# Patient Record
Sex: Female | Born: 1975 | Race: White | Hispanic: No | Marital: Married | Smoking: Never smoker
Health system: Southern US, Community
[De-identification: ages and names within clinical notes are randomized; demographics above are authoritative.]

## PROBLEM LIST (undated history)

## (undated) DIAGNOSIS — D649 Anemia, unspecified: Secondary | ICD-10-CM

## (undated) DIAGNOSIS — IMO0002 Reserved for concepts with insufficient information to code with codable children: Secondary | ICD-10-CM

## (undated) DIAGNOSIS — N814 Uterovaginal prolapse, unspecified: Secondary | ICD-10-CM

## (undated) HISTORY — DX: Anemia, unspecified: D64.9

## (undated) HISTORY — PX: OTHER SURGICAL HISTORY: SHX169

## (undated) HISTORY — DX: Reserved for concepts with insufficient information to code with codable children: IMO0002

## (undated) HISTORY — DX: Uterovaginal prolapse, unspecified: N81.4

---

## 2002-12-12 ENCOUNTER — Emergency Department (HOSPITAL_COMMUNITY): Admission: EM | Admit: 2002-12-12 | Discharge: 2002-12-12 | Payer: Self-pay | Admitting: Emergency Medicine

## 2003-08-13 ENCOUNTER — Other Ambulatory Visit: Admission: RE | Admit: 2003-08-13 | Discharge: 2003-08-13 | Payer: Self-pay | Admitting: Family Medicine

## 2004-09-01 ENCOUNTER — Other Ambulatory Visit: Admission: RE | Admit: 2004-09-01 | Discharge: 2004-09-01 | Payer: Self-pay | Admitting: Gynecology

## 2005-03-25 ENCOUNTER — Inpatient Hospital Stay (HOSPITAL_COMMUNITY): Admission: AD | Admit: 2005-03-25 | Discharge: 2005-03-27 | Payer: Self-pay | Admitting: Gynecology

## 2005-05-05 ENCOUNTER — Other Ambulatory Visit: Admission: RE | Admit: 2005-05-05 | Discharge: 2005-05-05 | Payer: Self-pay | Admitting: Gynecology

## 2006-05-17 ENCOUNTER — Other Ambulatory Visit: Admission: RE | Admit: 2006-05-17 | Discharge: 2006-05-17 | Payer: Self-pay | Admitting: Gynecology

## 2007-06-19 ENCOUNTER — Other Ambulatory Visit: Admission: RE | Admit: 2007-06-19 | Discharge: 2007-06-19 | Payer: Self-pay | Admitting: Gynecology

## 2008-04-01 ENCOUNTER — Inpatient Hospital Stay (HOSPITAL_COMMUNITY): Admission: AD | Admit: 2008-04-01 | Discharge: 2008-04-04 | Payer: Self-pay | Admitting: Obstetrics and Gynecology

## 2009-02-17 ENCOUNTER — Ambulatory Visit (HOSPITAL_COMMUNITY): Admission: RE | Admit: 2009-02-17 | Discharge: 2009-02-17 | Payer: Self-pay | Admitting: Gynecology

## 2009-06-03 ENCOUNTER — Other Ambulatory Visit: Admission: RE | Admit: 2009-06-03 | Discharge: 2009-06-03 | Payer: Self-pay | Admitting: Gynecology

## 2009-06-03 ENCOUNTER — Ambulatory Visit: Payer: Self-pay | Admitting: Gynecology

## 2009-06-09 ENCOUNTER — Ambulatory Visit: Payer: Self-pay | Admitting: Gynecology

## 2010-06-09 ENCOUNTER — Other Ambulatory Visit
Admission: RE | Admit: 2010-06-09 | Discharge: 2010-06-09 | Payer: Self-pay | Source: Home / Self Care | Admitting: Gynecology

## 2010-06-09 ENCOUNTER — Ambulatory Visit
Admission: RE | Admit: 2010-06-09 | Discharge: 2010-06-09 | Payer: Self-pay | Source: Home / Self Care | Attending: Gynecology | Admitting: Gynecology

## 2010-06-17 ENCOUNTER — Ambulatory Visit
Admission: RE | Admit: 2010-06-17 | Discharge: 2010-06-17 | Payer: Self-pay | Source: Home / Self Care | Attending: Gynecology | Admitting: Gynecology

## 2010-06-27 ENCOUNTER — Encounter: Payer: Self-pay | Admitting: Gynecology

## 2010-07-02 ENCOUNTER — Ambulatory Visit (HOSPITAL_COMMUNITY)
Admission: RE | Admit: 2010-07-02 | Discharge: 2010-07-02 | Payer: Self-pay | Source: Home / Self Care | Attending: Gynecology | Admitting: Gynecology

## 2010-10-19 NOTE — H&P (Signed)
NAMEFIORA, WEILL NO.:  [id number]   MEDICAL RECORD NO.:  [id number]           PATIENT TYPE:   LOCATION:                                FACILITY:  [facility]   PHYSICIAN:  Guy Sandifer. Henderson Cloud, M.D. DATE OF BIRTH:  [birth year]   DATE OF ADMISSION:  04/01/2008  DATE OF DISCHARGE:                              HISTORY & PHYSICAL   CHIEF COMPLAINT:  Breech.   HISTORY OF PRESENT ILLNESS:  This patient is a 35 year old, G2, P76, with  an EDC of April 07, 2008, placing her at 2 and one half weeks  estimated gestational age.  Ultrasound in the office confirms breech  position.  After discussion of the options, the patient is being  admitted for cesarean section.  Potential risks and complications have  been discussed with the patient preoperatively.   PAST MEDICAL HISTORY, PAST SURGICAL HISTORY, FAMILY HISTORY, OBSTETRIC  HISTORY:  See prenatal history and physical.   MEDICATIONS:  Prenatal vitamins.   No known drug allergies.   PHYSICAL EXAMINATION:  VITAL SIGNS:  Height 5 feet 3 inches, weight 157  pounds, blood pressure 104/70.  LUNGS:  Clear to auscultation.  HEART:  Regular rate and rhythm.  ABDOMEN:  Gravid, nontender.  No epigastric tenderness.  Cervix is 2,  50% effaced.  EXTREMITIES:  Grossly within normal limits.  NEUROLOGIC:  Grossly within normal limits.   ASSESSMENT:  Breech position.   PLAN:  Cesarean section.      Guy Sandifer Henderson Cloud, M.D.  Electronically Signed     JET/MEDQ  D:  03/31/2008  T:  04/01/2008  Job:  [id number]

## 2010-10-19 NOTE — Op Note (Signed)
Chloe Valdez, Chloe Valdez           ACCOUNT NO.:  [id number]   MEDICAL RECORD NO.:  [id number]          PATIENT TYPE:  INP   LOCATION:  9131                          FACILITY:  [facility]   PHYSICIAN:  Guy Sandifer. Henderson Cloud, M.D. DATE OF BIRTH:  05-11-1976   DATE OF PROCEDURE:  04/01/2008  DATE OF DISCHARGE:                               OPERATIVE REPORT   PREOPERATIVE DIAGNOSES:  1. Intrauterine pregnancy at 39-1/2 weeks' estimated gestational age.  2. Breech.   POSTOPERATIVE DIAGNOSIS:  Intrauterine pregnancy at 39-1/2 weeks'  estimated gestational age.   PROCEDURE:  Low transverse cesarean section.   SURGEON:  Guy Sandifer. Henderson Cloud, MD   ANESTHESIA:  Spinal, Quillian Quince, MD   ESTIMATED BLOOD LOSS:  500 mL.   FINDINGS:  Viable female infant, Apgars of 9 and 9 at 1 and 5 minutes  respectively.  Birth weight pending.  Arterial cord pH 7.31.   INDICATIONS AND CONSENT:  This patient is a 35 year old married white  female G2, P1 with an EDC of April 07, 2008.  Ultrasound in the office  has confirmed breech presentation for multiple checks.  Options are  discussed with the patient.  Cesarean section is reviewed.  Potential  risks and complications are reviewed preoperatively including but  limited to infection, organ damage, bleeding requiring transfusion of  blood products with HIV, hepatitis acquisition, DVT, PE, pneumonia.  All  questions were answered and consent is signed on the chart.   PROCEDURE:  The patient was taken to the operating room where she was  identified.  Spinal anesthetic was placed and she was placed in dorsal  supine position with 15-degree left lateral wedge.  She was prepped,  Foley catheter was placed, and the bladder was drained, and she was  draped in a sterile fashion.  After testing for adequate spinal  anesthesia, skin was entered through a Pfannenstiel incision and  dissection was carried out in layers to the peritoneum.  Peritoneum was  incised and extended  superiorly and inferiorly.  Vesicouterine  peritoneum was taken down cephalad laterally.  Bladder flap was  developed and bladder blade was placed.  The uterus was incised in a low-  transverse manner and the uterine cavity was entered bluntly with a  hemostat.  The uterine incision was extended cephalad laterally with  fingers.  Artificial rupture of membranes revealed clear fluid.  The  baby was now on the vertex position.  The vertex was delivered without  difficulty.  Oropharynx and nasopharynx were suctioned.  Nuchal cord x1  was reduced.  Remainder of the baby was delivered and good cry and tone  was noted.  Cord was clamped and cut and the baby was handed off to the  awaiting pediatrics team.  Placenta was manually delivered.  Cavity was  cleaned.  Uterus was closed in 2 running locking imbricating layers of 0  Monocryl suture.  Good hemostasis was achieved.  Tubes and ovaries were  normal bilaterally.  Anterior peritoneum was closed in running  fashion with 0 Monocryl suture which was also used to reapproximate the  pyramydalis muscle in midline.  Anterior rectus  fascia was closed in  running fashion with 0 PDS suture and the skin was closed with clips.  All sponge, instrument, and needle counts were correct and the patient  was transferred to recovery room in stable condition.      Guy Sandifer Henderson Cloud, M.D.  Electronically Signed     JET/MEDQ  D:  04/01/2008  T:  04/02/2008  Job:  [id number]

## 2010-10-22 NOTE — Discharge Summary (Signed)
NAMEMARGUERETTE, Chloe Valdez           ACCOUNT NO.:  [id number]   MEDICAL RECORD NO.:  [id number]          PATIENT TYPE:  INP   LOCATION:  9131                          [facility]ILITY:  [facility]   PHYSICIAN:  Guy Sandifer. Henderson Cloud, M.D. DATE OF BIRTH:  08/04/1975   DATE OF ADMISSION:  04/01/2008  DATE OF DISCHARGE:  04/04/2008                               DISCHARGE SUMMARY   ADMITTING DIAGNOSES:  1. Intrauterine pregnancy at 39-1/2 weeks estimated gestational age.  2. Breech presentation.   DISCHARGE DIAGNOSES:  1. Status post low-transverse cesarean section.  2. Viable female infant.   PROCEDURE:  Primary low-transverse cesarean section.   REASON FOR ADMISSION:  Please see dictated H&P.   HOSPITAL COURSE:  The patient is a 35 year old gravida 2, para 1 that  was admitted to [facility] at 39-1/2 weeks' estimated  gestational age.  Ultrasound had been performed in the office, which  confirmed a breech presentation.  After careful discussion with the  patient, the patient was now admitted for a cesarean delivery.  On the  morning of admission, the patient was taken to operating room where  spinal anesthesia was administered without difficulty.  A low-transverse  incision was made with delivery of a viable female infant weighing 7  pounds 3 ounces with Apgars of 9 at one minute and 9 at five minutes.  Arterial cord pH was 7.31.  The patient tolerated the procedure well and  was taken to the recovery room in stable condition.  On postoperative  day #1, the patient was without complaint.  Vital signs stable.  She was  afebrile.  Blood pressure was 86/47, 106/71.  Abdomen soft.  Fundus firm  and nontender.  Abdominal dressing noted to be clean, dry, and intact.  Foley was draining with adequate urine output.  Laboratory findings  showed hemoglobin 11.2, platelet count of 88,000.  Platelet count was  noted to be 120 on admission and WBC count was 9.8.  Motrin was held on  the schedule and  repeat CBC was ordered for the following morning.  Foley was discontinued.  IV was reduced to a saline lock.  On  postoperative day #2, the patient was without complaint.  Vital signs  remained stable.  She was afebrile.  Fundus firm and nontender.  Incision was clean, dry, and intact.  Repeat labs revealed hemoglobin  11.7, platelet count was up to 102,000, WBC count of 9.2.  On  postoperative day #3, the patient was without complaint.  Vital signs  remained stable.  She was afebrile.  Fundus firm and nontender.  Incision was clean, dry, and intact.  Staples removed, and the patient  was later discharged home.   CONDITION ON DISCHARGE:  Stable.   DIET:  Regular as tolerated.   ACTIVITY:  No heavy lifting, no driving x2 weeks, no vaginal entry.   FOLLOW UP:  The patient to follow up in the office in 1-2 weeks for an  incision check.  She is to call for temperature greater than 100  degrees, persistent nausea, vomiting, heavy vaginal bleeding, and/or  redness or drainage from the incisional site.  DISCHARGE MEDICATIONS:  Tylox #30, 1 p.o. every 4-6 hours p.r.n., Motrin  600 mg every 6 hours, prenatal vitamins 1 p.o. daily, Colace 1 p.o.  daily p.r.n.      Julio Sicks, N.P.      Guy Sandifer. Henderson Cloud, M.D.  Electronically Signed    CC/MEDQ  D:  04/29/2008  T:  04/29/2008  Job:  [id number]

## 2010-10-22 NOTE — H&P (Signed)
NAME:  Chloe Valdez, Chloe Valdez NO.:  [id number]   MEDICAL RECORD NO.:  [id number]          PATIENT TYPE:  MAT   LOCATION:  [facility]                          [facility]ACILITY:  [facility]   PHYSICIAN:  Timothy P. Fontaine, M.D.DATE OF BIRTH:  03-16-1976   DATE OF ADMISSION:  03/25/2005  DATE OF DISCHARGE:                                HISTORY & PHYSICAL   CHIEF COMPLAINT:  1.  Pregnancy at term.  2.  Rupture of membranes, spontaneous.   HISTORY OF PRESENT ILLNESS:  A 35 year old G1 P0 female at [redacted] weeks  gestation who upon rising this morning had spontaneous rupture of membranes.  She is not feeling any contractions. Her prenatal course has been  uncomplicated to date. Her beta strep status is unknown at this time. For  the remainder of her history, see her [facility].   PHYSICAL EXAMINATION:  VITAL SIGNS:  Afebrile, vital signs are stable.  HEENT:  Normal.  LUNGS:  Clear.  CARDIAC:  Regular rate without rubs, murmurs, or gallops.  ABDOMEN:  Gravid, vertex fetus consistent with term. External monitors show  reactive fetal tracing with contractions every 1-and-a-half minutes.  PELVIC:  80%, loose fingertip, 0 station, posterior, with clear spontaneous  rupture of membranes.   ASSESSMENT AND PLAN:  A 35 year old gravida 1 para 0, term gestation,  spontaneous rupture of membranes, now contracting, reactive fetus. Admit for  labor and delivery. Will verify strep status, antibiotic prophylaxis if  strep positive.      Timothy P. Fontaine, M.D.  Electronically Signed     TPF/MEDQ  D:  03/25/2005  T:  03/25/2005  Job:  [id number]

## 2011-03-07 LAB — CBC
HCT: 33.7 — ABNORMAL LOW
HCT: 37.8
Hemoglobin: 11.7 — ABNORMAL LOW
MCHC: 33.7
MCV: 93
MCV: 93.1
Platelets: 102 — ABNORMAL LOW
Platelets: 120 — ABNORMAL LOW
Platelets: 88 — ABNORMAL LOW
RDW: 13.4
RDW: 13.4
RDW: 13.7
WBC: 10.1
WBC: 9.8

## 2011-06-17 ENCOUNTER — Ambulatory Visit (INDEPENDENT_AMBULATORY_CARE_PROVIDER_SITE_OTHER): Payer: BC Managed Care – PPO | Admitting: Gynecology

## 2011-06-17 ENCOUNTER — Encounter: Payer: Self-pay | Admitting: Gynecology

## 2011-06-17 VITALS — BP 100/60 | Ht 63.5 in | Wt 128.0 lb

## 2011-06-17 DIAGNOSIS — Z3041 Encounter for surveillance of contraceptive pills: Secondary | ICD-10-CM

## 2011-06-17 DIAGNOSIS — Z01419 Encounter for gynecological examination (general) (routine) without abnormal findings: Secondary | ICD-10-CM

## 2011-06-17 DIAGNOSIS — Z13220 Encounter for screening for lipoid disorders: Secondary | ICD-10-CM

## 2011-06-17 DIAGNOSIS — Z131 Encounter for screening for diabetes mellitus: Secondary | ICD-10-CM

## 2011-06-17 LAB — URINALYSIS W MICROSCOPIC + REFLEX CULTURE
Ketones, ur: NEGATIVE mg/dL
Leukocytes, UA: NEGATIVE
Nitrite: NEGATIVE
Specific Gravity, Urine: 1.01 (ref 1.005–1.030)
Urobilinogen, UA: 0.2 mg/dL (ref 0.0–1.0)
pH: 7 (ref 5.0–8.0)

## 2011-06-17 MED ORDER — NORGESTIMATE-ETH ESTRADIOL 0.25-35 MG-MCG PO TABS: 1.0000 | ORAL_TABLET | Freq: Every day | ORAL | Status: DC

## 2011-06-17 NOTE — Progress Notes (Signed)
Chloe Valdez January 08, 1976 [id number]        36 y.o.  for annual exam.  Doing well no complaints on Sprintec BCPs  Past medical history,surgical history, medications, allergies, family history and social history were all reviewed and documented in the EPIC chart. ROS:  Was performed and pertinent positives and negatives are included in the history.  Exam: Kim chaperone present Filed Vitals:   06/17/11 0954  BP: 100/60   General appearance  Normal Skin grossly normal Head/Neck normal with no cervical or supraclavicular adenopathy thyroid normal Lungs  clear Cardiac RR, without RMG Abdominal  soft, nontender, without masses, organomegaly or hernia Breasts  examined lying and sitting without masses, retractions, discharge or axillary adenopathy. Pelvic  Ext/BUS/vagina  normal   Cervix  normal    Uterus  retroverted, normal size, shape and contour, midline and mobile nontender   Adnexa  Without masses or tenderness    Anus and perineum  normal   Rectovaginal  normal sphincter tone without palpated masses or tenderness.    Assessment/Plan:  36 y.o. female for annual exam.    1. Contraception. Patients on Sprintec BCPs but is considering Mirena IUD. We've reviewed this and she tentatively will make an appointment to schedule this. She'll continue on her Sprintec until then and I refilled her times a year in the event she changes her mind. 2. Mammogram. Patient's mother had breast cancer in her 7s. No other family history of breast cancer or ovarian cancer. The issue of genetic screening was again discussed with her as previously and of offered referral to genetic counselor to discuss if she desires and she declines. I did recommend continuing with annual mammography and she is going to schedule her mammogram this year. SBE monthly reviewed. 3. Pap smear. No Pap smear was done today. She has no history of abnormal Pap smears in the past and numerous normal reports of her chart, the last  in 2011. I discussed current screening guidelines with her we'll plan on an every 3 year screening she agrees with this. 4. Health maintenance.   Will check baseline CBC glucose urinalysis and lipid profile.    Dara Lords MD, 10:22 AM 06/17/2011

## 2011-06-17 NOTE — Patient Instructions (Signed)
Follow up for IUD placement as you choose. Otherwise annual gynecologic follow up.

## 2011-06-18 LAB — CBC WITH DIFFERENTIAL/PLATELET
Basophils Absolute: 0 10*3/uL (ref 0.0–0.1)
Basophils Relative: 0 % (ref 0–1)
Eosinophils Absolute: 0.2 10*3/uL (ref 0.0–0.7)
HCT: 41.8 % (ref 36.0–46.0)
Hemoglobin: 13.3 g/dL (ref 12.0–15.0)
MCH: 28.1 pg (ref 26.0–34.0)
MCHC: 31.8 g/dL (ref 30.0–36.0)
Monocytes Absolute: 0.5 10*3/uL (ref 0.1–1.0)
Monocytes Relative: 6 % (ref 3–12)
Neutro Abs: 5.3 10*3/uL (ref 1.7–7.7)
Neutrophils Relative %: 58 % (ref 43–77)
RDW: 14.5 % (ref 11.5–15.5)

## 2011-06-18 LAB — LIPID PANEL
LDL Cholesterol: 121 mg/dL — ABNORMAL HIGH (ref 0–99)
Triglycerides: 101 mg/dL (ref <150)
VLDL: 20 mg/dL (ref 0–40)

## 2011-06-18 LAB — GLUCOSE, RANDOM: Glucose, Bld: 78 mg/dL (ref 70–99)

## 2011-06-22 ENCOUNTER — Other Ambulatory Visit: Payer: Self-pay | Admitting: Gynecology

## 2011-06-22 DIAGNOSIS — Z1231 Encounter for screening mammogram for malignant neoplasm of breast: Secondary | ICD-10-CM

## 2011-07-22 ENCOUNTER — Ambulatory Visit (HOSPITAL_COMMUNITY)
Admission: RE | Admit: 2011-07-22 | Discharge: 2011-07-22 | Disposition: A | Discharge status: Home / Self Care | Payer: BC Managed Care – PPO | Source: Ambulatory Visit | Attending: Gynecology | Admitting: Gynecology

## 2011-07-22 DIAGNOSIS — Z1231 Encounter for screening mammogram for malignant neoplasm of breast: Secondary | ICD-10-CM | POA: Insufficient documentation

## 2011-08-29 ENCOUNTER — Other Ambulatory Visit: Payer: Self-pay | Admitting: Gynecology

## 2012-06-01 IMAGING — MG MM DIGITAL SCREENING BILAT
5 series · 5 of 5 positions shown · non-contrast
Comparison: none

DG SCREEN MAMMOGRAM BILATERAL
Bilateral CC and MLO view(s) were taken.

DIGITAL SCREENING MAMMOGRAM WITH CAD:
The breast tissue is heterogeneously dense.  No masses or malignant type calcifications are 
identified.  Compared with prior studies.
Images were processed with CAD.

[R CC (1 of 2)]
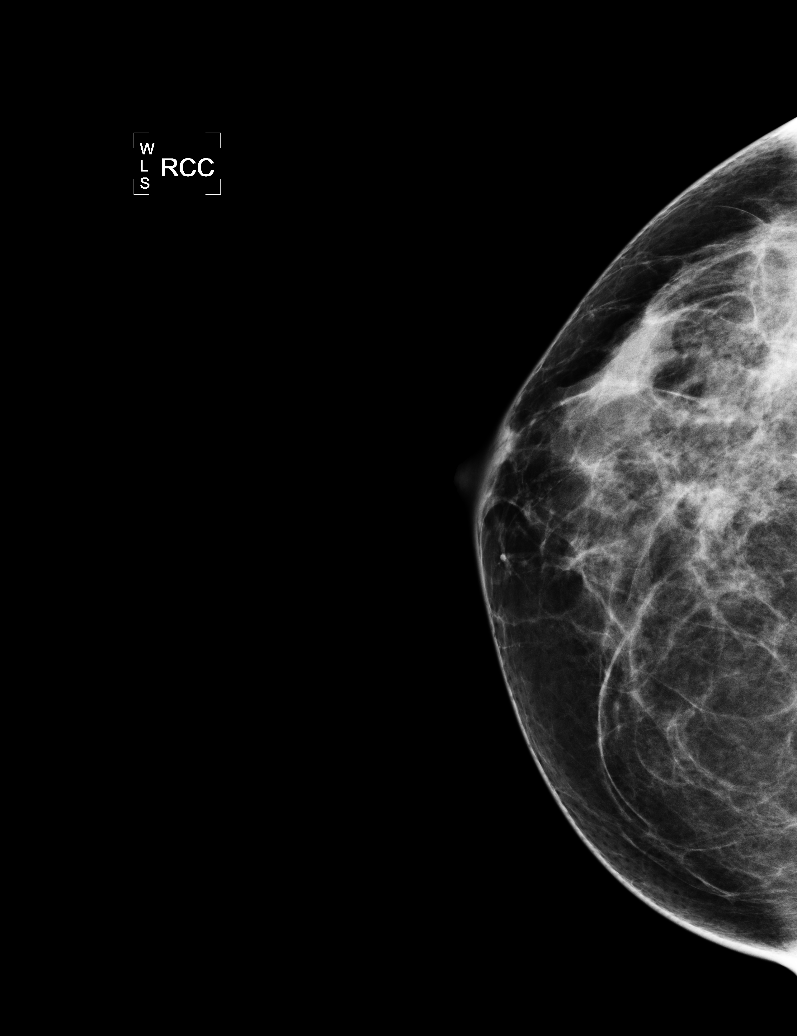

[R MLO]
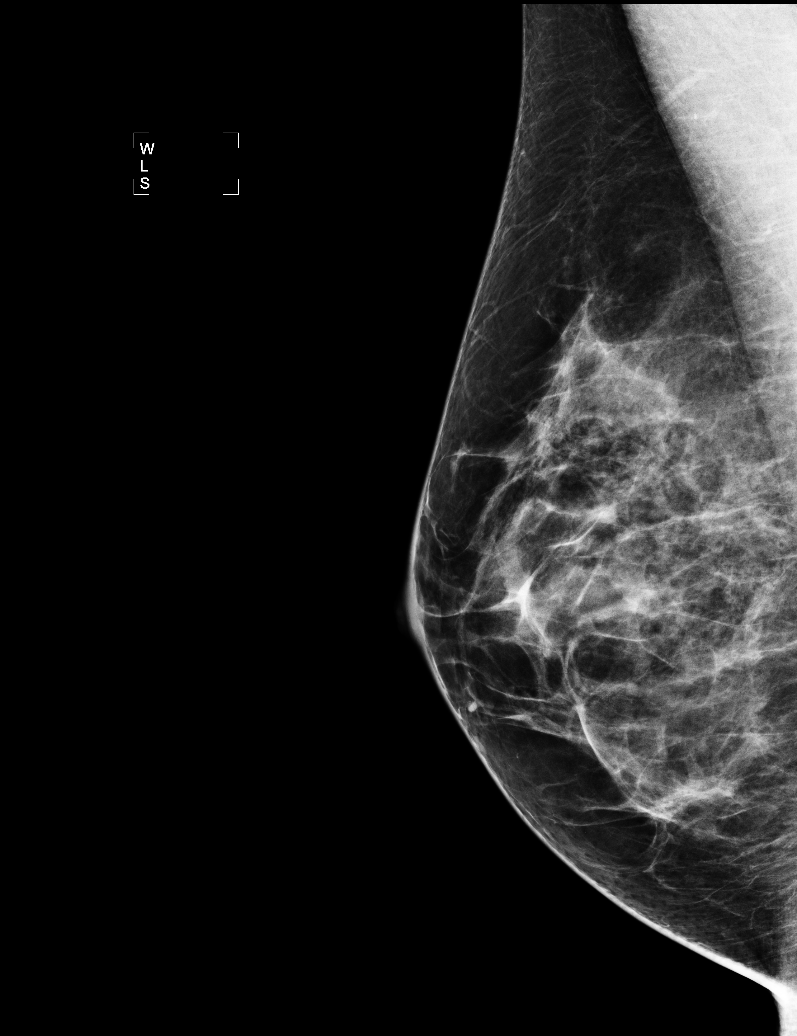

[L CC]
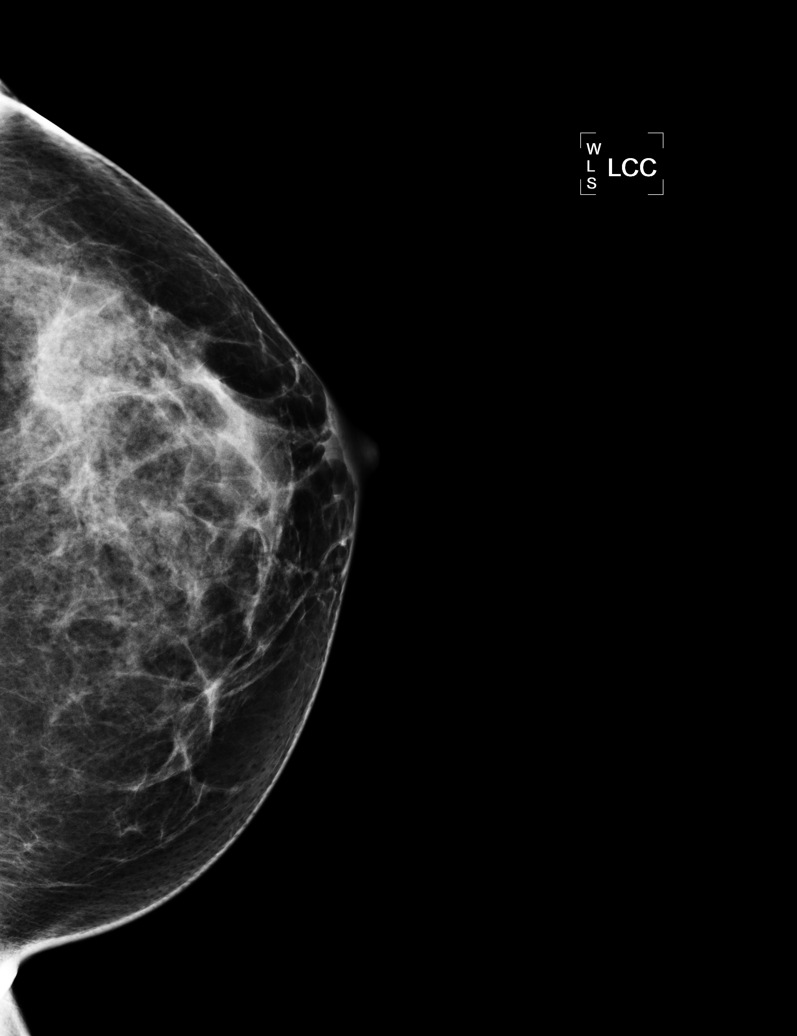

[L MLO]
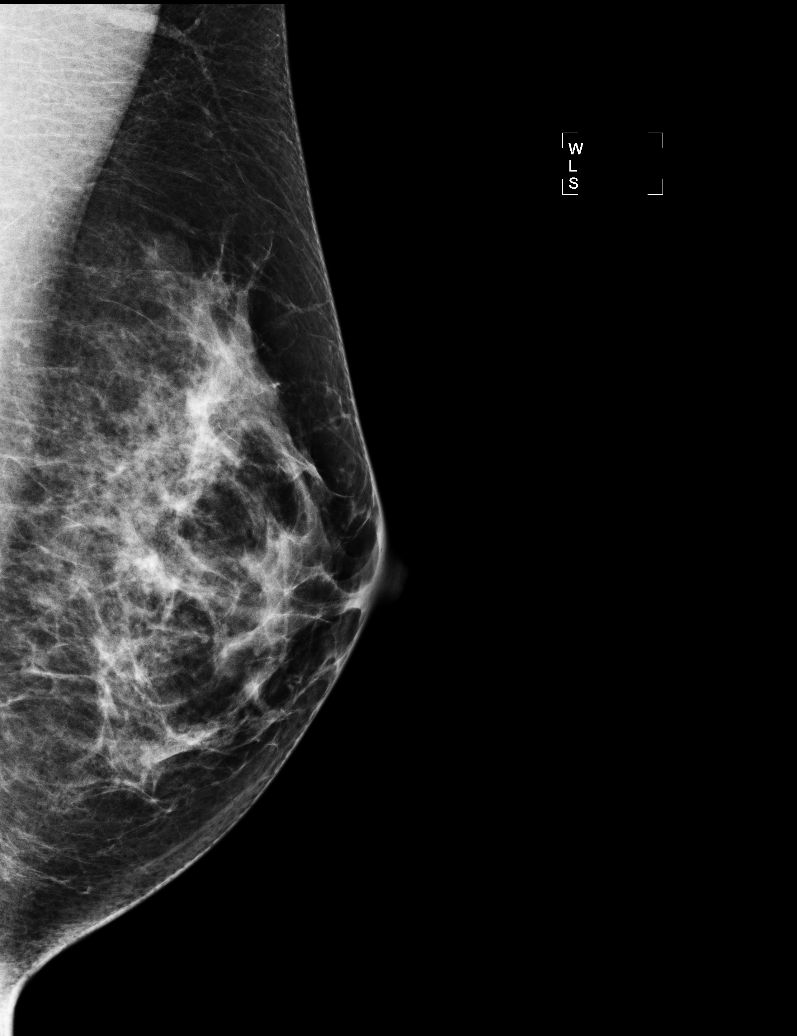

[R CC (2 of 2)]
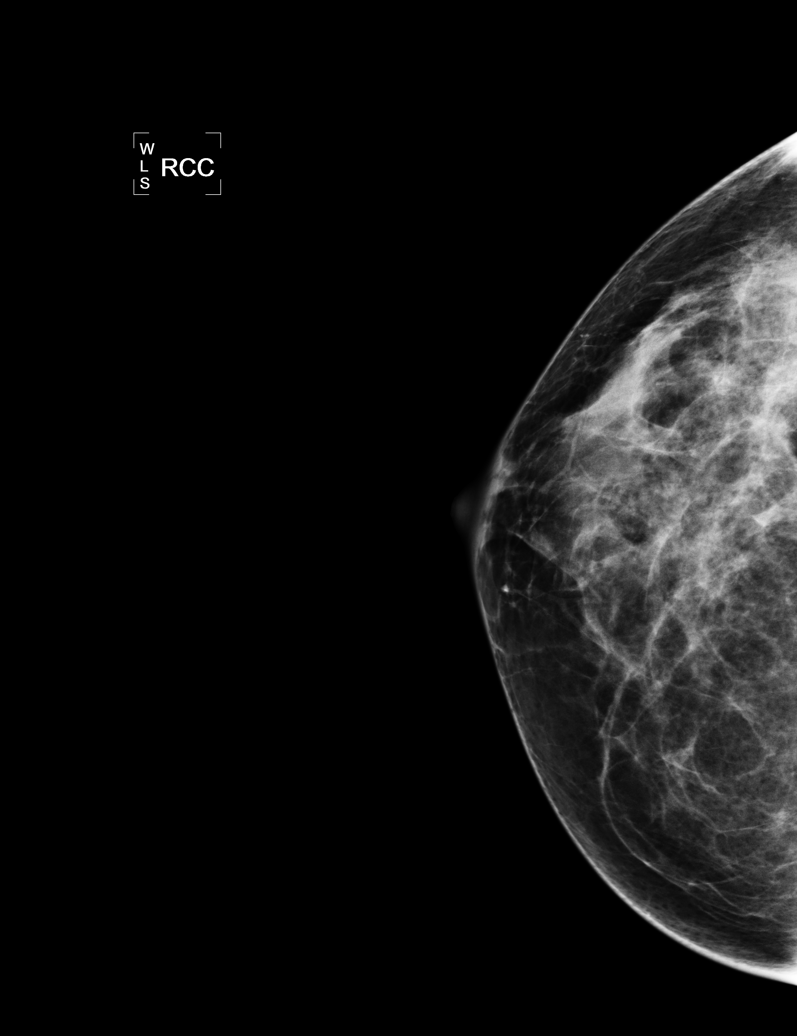

[5 of 5 positions shown; findings below may reference images not displayed]

IMPRESSION: No specific mammographic evidence of malignancy.  Next screening mammogram is recommended in one 
year.

A result letter of this screening mammogram will be mailed directly to the patient.

ASSESSMENT: Negative - BI-RADS 1

Screening mammogram in 1 year.
,

## 2012-06-27 ENCOUNTER — Other Ambulatory Visit: Payer: Self-pay | Admitting: Gynecology

## 2012-06-27 ENCOUNTER — Ambulatory Visit (INDEPENDENT_AMBULATORY_CARE_PROVIDER_SITE_OTHER): Payer: BC Managed Care – PPO | Admitting: Gynecology

## 2012-06-27 ENCOUNTER — Encounter: Payer: Self-pay | Admitting: Gynecology

## 2012-06-27 VITALS — BP 104/64 | Ht 63.5 in | Wt 131.0 lb

## 2012-06-27 DIAGNOSIS — Z01419 Encounter for gynecological examination (general) (routine) without abnormal findings: Secondary | ICD-10-CM

## 2012-06-27 DIAGNOSIS — Z1231 Encounter for screening mammogram for malignant neoplasm of breast: Secondary | ICD-10-CM

## 2012-06-27 DIAGNOSIS — Z13220 Encounter for screening for lipoid disorders: Secondary | ICD-10-CM

## 2012-06-27 DIAGNOSIS — Z803 Family history of malignant neoplasm of breast: Secondary | ICD-10-CM

## 2012-06-27 LAB — CBC WITH DIFFERENTIAL/PLATELET
Basophils Absolute: 0 10*3/uL (ref 0.0–0.1)
Eosinophils Relative: 1 % (ref 0–5)
Lymphocytes Relative: 36 % (ref 12–46)
Lymphs Abs: 2.9 10*3/uL (ref 0.7–4.0)
Neutro Abs: 4.4 10*3/uL (ref 1.7–7.7)
Neutrophils Relative %: 55 % (ref 43–77)
Platelets: 231 10*3/uL (ref 150–400)
RBC: 4.86 MIL/uL (ref 3.87–5.11)
RDW: 14.7 % (ref 11.5–15.5)
WBC: 8 10*3/uL (ref 4.0–10.5)

## 2012-06-27 LAB — COMPREHENSIVE METABOLIC PANEL
ALT: 10 U/L (ref 0–35)
AST: 14 U/L (ref 0–37)
CO2: 21 mEq/L (ref 19–32)
Calcium: 9.3 mg/dL (ref 8.4–10.5)
Chloride: 106 mEq/L (ref 96–112)
Creat: 0.64 mg/dL (ref 0.50–1.10)
Sodium: 137 mEq/L (ref 135–145)
Total Protein: 7.2 g/dL (ref 6.0–8.3)

## 2012-06-27 LAB — LIPID PANEL
Total CHOL/HDL Ratio: 3.6 Ratio
VLDL: 25 mg/dL (ref 0–40)

## 2012-06-27 MED ORDER — NORGESTIMATE-ETH ESTRADIOL 0.25-35 MG-MCG PO TABS: 1.0000 | ORAL_TABLET | Freq: Every day | ORAL | Status: DC

## 2012-06-27 NOTE — Progress Notes (Signed)
Chloe Valdez Jul 16, 1975 [id number]        37 y.o.  G2P2 for annual exam.  Without complaints.  Past medical history,surgical history, medications, allergies, family history and social history were all reviewed and documented in the EPIC chart. ROS:  Was performed and pertinent positives and negatives are included in the history.  Exam: Sherrilyn Rist assistant Filed Vitals:   06/27/12 1234  BP: 104/64  Height: 5' 3.5" (1.613 m)  Weight: 131 lb (59.421 kg)   General appearance  Normal Skin small raised dark mole midline anterior chest wall below breast Head/Neck normal with no cervical or supraclavicular adenopathy thyroid normal Lungs  clear Cardiac RR, without RMG Abdominal  soft, nontender, without masses, organomegaly or hernia Breasts  examined lying and sitting without masses, retractions, discharge or axillary adenopathy. Pelvic  Ext/BUS/vagina  normal   Cervix  normal   Uterus  anteverted, normal size, shape and contour, midline and mobile nontender   Adnexa  Without masses or tenderness    Anus and perineum  normal   Rectovaginal  normal sphincter tone without palpated masses or tenderness.    Assessment/Plan:  37 y.o. G2P2 female for annual exam, regular menses, oral contraceptives..   1. Small mobile anterior left chest wall. Patient knows is he is to have gotten larger and darker. She does have a number of other moles on her anterior and posterior trunk. Recommend dermatology evaluation for mole check and excision of that one mole. Patient agrees to schedule. I did discuss with her if they delay scheduling the appointment she can come back and I will remove that one mole. 2. Maternal history of breast cancer in her 1s. No other family history of breast/ovarian/uterine/bowel cancer. Has been getting annual mammography. We again discussed the issues of genetic screening and I again offered referral to genetic counseling and she declines. Did recommend this year when she  schedules her mammograms to do a 3-D tomosymphysis and she agrees to arrange this.  SBE monthly reviewed. 3. Pap smear 2012. No Pap smear done today. No history of abnormal Pap smears previously. Plan repeat next year at 3 year interval. 4. Oral contraceptives. Doing well, does not smoke and not being followed for any medical reason. Wants to continue at present but is considering Mirena IUD. I refilled her birth control pills times a year follow up sooner she wants Mirena IUD. Risks of stroke heart attack DVT with BCPs reviewed. 5. Health maintenance. Baseline CBC comp and some metabolic panel lipid profile and urinalysis ordered. Follow up one year, sooner as needed.    Dara Lords MD, 12:54 PM 06/27/2012

## 2012-06-27 NOTE — Patient Instructions (Addendum)
Schedule 3 D mammogram Tomosynthesis Schedule dermatology appt Follow up in one year for Gyn exam

## 2012-06-28 ENCOUNTER — Other Ambulatory Visit: Payer: Self-pay | Admitting: Gynecology

## 2012-06-28 DIAGNOSIS — E7800 Pure hypercholesterolemia, unspecified: Secondary | ICD-10-CM

## 2012-06-28 LAB — URINALYSIS W MICROSCOPIC + REFLEX CULTURE
Crystals: NONE SEEN
Glucose, UA: NEGATIVE mg/dL
Leukocytes, UA: NEGATIVE
Protein, ur: NEGATIVE mg/dL
Specific Gravity, Urine: 1.021 (ref 1.005–1.030)
Squamous Epithelial / LPF: NONE SEEN
Urobilinogen, UA: 0.2 mg/dL (ref 0.0–1.0)

## 2012-07-02 ENCOUNTER — Other Ambulatory Visit: Payer: Self-pay | Admitting: Gynecology

## 2012-07-31 ENCOUNTER — Ambulatory Visit
Admission: RE | Admit: 2012-07-31 | Discharge: 2012-07-31 | Disposition: A | Discharge status: Home / Self Care | Payer: BC Managed Care – PPO | Source: Ambulatory Visit | Attending: Gynecology | Admitting: Gynecology

## 2012-07-31 DIAGNOSIS — Z803 Family history of malignant neoplasm of breast: Secondary | ICD-10-CM

## 2012-07-31 DIAGNOSIS — Z1231 Encounter for screening mammogram for malignant neoplasm of breast: Secondary | ICD-10-CM

## 2013-06-03 ENCOUNTER — Other Ambulatory Visit: Payer: Self-pay | Admitting: Gynecology

## 2013-06-28 ENCOUNTER — Encounter: Payer: BC Managed Care – PPO | Admitting: Gynecology

## 2013-07-08 ENCOUNTER — Other Ambulatory Visit: Payer: Self-pay | Admitting: Gynecology

## 2013-07-09 ENCOUNTER — Other Ambulatory Visit: Payer: Self-pay | Admitting: Gynecology

## 2013-07-09 ENCOUNTER — Other Ambulatory Visit: Payer: Self-pay | Admitting: Dermatology

## 2013-07-18 ENCOUNTER — Encounter: Payer: Self-pay | Admitting: Gynecology

## 2013-07-18 ENCOUNTER — Telehealth: Payer: Self-pay | Admitting: Genetic Counselor

## 2013-07-18 ENCOUNTER — Other Ambulatory Visit (HOSPITAL_COMMUNITY)
Admission: RE | Admit: 2013-07-18 | Discharge: 2013-07-18 | Disposition: A | Discharge status: Home / Self Care | Payer: BC Managed Care – PPO | Source: Ambulatory Visit | Attending: Gynecology | Admitting: Gynecology

## 2013-07-18 ENCOUNTER — Ambulatory Visit (INDEPENDENT_AMBULATORY_CARE_PROVIDER_SITE_OTHER): Payer: BC Managed Care – PPO | Admitting: Gynecology

## 2013-07-18 ENCOUNTER — Telehealth: Payer: Self-pay | Admitting: *Deleted

## 2013-07-18 VITALS — BP 120/76 | Ht 64.0 in | Wt 140.0 lb

## 2013-07-18 DIAGNOSIS — Z1151 Encounter for screening for human papillomavirus (HPV): Secondary | ICD-10-CM | POA: Insufficient documentation

## 2013-07-18 DIAGNOSIS — Z01419 Encounter for gynecological examination (general) (routine) without abnormal findings: Secondary | ICD-10-CM

## 2013-07-18 LAB — LIPID PANEL
CHOL/HDL RATIO: 3.1 ratio
Cholesterol: 214 mg/dL — ABNORMAL HIGH (ref 0–200)
HDL: 70 mg/dL (ref >39)
LDL CALC: 117 mg/dL — AB (ref 0–99)
Triglycerides: 136 mg/dL (ref <150)
VLDL: 27 mg/dL (ref 0–40)

## 2013-07-18 LAB — URINALYSIS W MICROSCOPIC + REFLEX CULTURE
Bacteria, UA: NONE SEEN
Bilirubin Urine: NEGATIVE
Casts: NONE SEEN
Crystals: NONE SEEN
GLUCOSE, UA: NEGATIVE mg/dL
HGB URINE DIPSTICK: NEGATIVE
Ketones, ur: NEGATIVE mg/dL
LEUKOCYTES UA: NEGATIVE
NITRITE: NEGATIVE
PH: 5.5 (ref 5.0–8.0)
PROTEIN: NEGATIVE mg/dL
Specific Gravity, Urine: 1.02 (ref 1.005–1.030)
Urobilinogen, UA: 0.2 mg/dL (ref 0.0–1.0)

## 2013-07-18 LAB — COMPREHENSIVE METABOLIC PANEL
ALBUMIN: 3.9 g/dL (ref 3.5–5.2)
ALK PHOS: 41 U/L (ref 39–117)
ALT: 8 U/L (ref 0–35)
AST: 14 U/L (ref 0–37)
BUN: 12 mg/dL (ref 6–23)
CALCIUM: 9.2 mg/dL (ref 8.4–10.5)
CHLORIDE: 106 meq/L (ref 96–112)
CO2: 22 mEq/L (ref 19–32)
Creat: 0.67 mg/dL (ref 0.50–1.10)
Glucose, Bld: 88 mg/dL (ref 70–99)
POTASSIUM: 3.7 meq/L (ref 3.5–5.3)
SODIUM: 137 meq/L (ref 135–145)
Total Bilirubin: 0.2 mg/dL (ref 0.2–1.2)
Total Protein: 6.4 g/dL (ref 6.0–8.3)

## 2013-07-18 LAB — CBC WITH DIFFERENTIAL/PLATELET
BASOS ABS: 0 10*3/uL (ref 0.0–0.1)
BASOS PCT: 0 % (ref 0–1)
Eosinophils Absolute: 0.1 10*3/uL (ref 0.0–0.7)
Eosinophils Relative: 2 % (ref 0–5)
HEMATOCRIT: 32.2 % — AB (ref 36.0–46.0)
Hemoglobin: 10.4 g/dL — ABNORMAL LOW (ref 12.0–15.0)
LYMPHS PCT: 36 % (ref 12–46)
Lymphs Abs: 2 10*3/uL (ref 0.7–4.0)
MCH: 24.9 pg — ABNORMAL LOW (ref 26.0–34.0)
MCHC: 32.3 g/dL (ref 30.0–36.0)
MCV: 77.2 fL — ABNORMAL LOW (ref 78.0–100.0)
Monocytes Absolute: 0.5 10*3/uL (ref 0.1–1.0)
Monocytes Relative: 9 % (ref 3–12)
NEUTROS ABS: 2.8 10*3/uL (ref 1.7–7.7)
NEUTROS PCT: 53 % (ref 43–77)
PLATELETS: 202 10*3/uL (ref 150–400)
RBC: 4.17 MIL/uL (ref 3.87–5.11)
RDW: 14.3 % (ref 11.5–15.5)
WBC: 5.4 10*3/uL (ref 4.0–10.5)

## 2013-07-18 MED ORDER — NORGESTIMATE-ETH ESTRADIOL 0.25-35 MG-MCG PO TABS: ORAL_TABLET | ORAL | Status: DC

## 2013-07-18 NOTE — Progress Notes (Signed)
Chloe Valdez Aug 13, 1975 [id number]        38 y.o.  G2P2 for annual exam.  Doing well without complaints.  Past medical history,surgical history, problem list, medications, allergies, family history and social history were all reviewed and documented in the EPIC chart.  ROS:  Performed and pertinent positives and negatives are included in the history, assessment and plan .  Exam: Kim assistant Filed Vitals:   07/18/13 0910  BP: 120/76  Height: 5\' 4"  (1.626 m)  Weight: 140 lb (63.504 kg)   General appearance  Normal Skin grossly normal Head/Neck normal with no cervical or supraclavicular adenopathy thyroid normal Lungs  clear Cardiac RR, without RMG Abdominal  soft, nontender, without masses, organomegaly or hernia Breasts  examined lying and sitting without masses, retractions, discharge or axillary adenopathy. Pelvic  Ext/BUS/vagina  mild cystocele and first degree uterine prolapse noted  Cervix  Normal. Pap smear/HPV done  Uterus  anteverted with first degree prolapse. Normal size, shape and contour, midline and mobile nontender   Adnexa  Without masses or tenderness    Anus and perineum  Normal   Rectovaginal  Normal sphincter tone without palpated masses or tenderness.    Assessment/Plan:  38 y.o. G2P2 female for annual exam regular menses, oral contraceptives.   1. Mild cystocele/uterine prolapse. Patient asymptomatic without pressure urinary or bowel symptoms. Patient will monitor for symptoms development otherwise will follow expectantly. 2. Contraceptive management. Patient continues on oral contraceptives.  We've discussed alternatives in the past and she is comfortable with continuing on the pills. Risks of thrombosis to include stroke heart attack DVT again reviewed. Does not smoke and not being followed for any medical issues. Sprintec equivalent refill x1 year. 3. Maternal history of breast cancer in her 39s. I again reviewed genetic possibilities and options for  genetic screening. If genetically positive significant risk of breast cancer and ovarian cancer reviewed. Options of increased surveillance versus prophylactic surgeries discussed. Patient does want to talk to a genetic counselor will help arrange that for her. Continue with annual mammography. SBE monthly reviewed. 4. Pap smear 2012. Pap/HPV done today. No history of abnormal Pap smears previously. Repeat at 3-5 year interval. 5. Health maintenance. Baseline CBC comprehensive metabolic panel lipid profile urinalysis ordered. Followup one year, sooner as needed.    Note: This document was prepared with digital dictation and possible smart phrase technology. Any transcriptional errors that result from this process are unintentional.   Anastasio Auerbach MD, 9:34 AM 07/18/2013

## 2013-07-18 NOTE — Addendum Note (Signed)
Addended by: Nelva Nay on: 07/18/2013 09:59 AM   Modules accepted: Orders

## 2013-07-18 NOTE — Telephone Encounter (Signed)
Message copied by Thamas Jaegers on Thu Jul 18, 2013 10:28 AM ------      Message from: Anastasio Auerbach      Created: Thu Jul 18, 2013  9:39 AM       Help patient arrange genetic counseling appointment with [facility] reference maternal history of breast cancer in her 46s ------

## 2013-07-18 NOTE — Telephone Encounter (Signed)
Appointment 08/19/13

## 2013-07-18 NOTE — Telephone Encounter (Signed)
Referral faxed to [facility], they will contact patient to schedule.

## 2013-07-18 NOTE — Patient Instructions (Signed)
Office will call you to arrange the genetic counseling appointment. Followup in one year for annual exam.

## 2013-07-18 NOTE — Telephone Encounter (Signed)
patient scheduled for genetic couseling @ 10 on 03/16 w/Karen Florene Glen.  Welcome packet mailed.

## 2013-08-06 ENCOUNTER — Other Ambulatory Visit: Payer: Self-pay | Admitting: Gynecology

## 2013-08-19 ENCOUNTER — Other Ambulatory Visit: Payer: BC Managed Care – PPO

## 2013-08-19 ENCOUNTER — Encounter: Payer: BC Managed Care – PPO | Admitting: Genetic Counselor

## 2013-09-05 ENCOUNTER — Other Ambulatory Visit: Payer: Self-pay

## 2013-09-05 DIAGNOSIS — Z1231 Encounter for screening mammogram for malignant neoplasm of breast: Secondary | ICD-10-CM

## 2013-09-19 ENCOUNTER — Ambulatory Visit
Admission: RE | Admit: 2013-09-19 | Discharge: 2013-09-19 | Disposition: A | Discharge status: Home / Self Care | Payer: BC Managed Care – PPO | Source: Ambulatory Visit

## 2013-09-19 DIAGNOSIS — Z1231 Encounter for screening mammogram for malignant neoplasm of breast: Secondary | ICD-10-CM

## 2013-09-25 ENCOUNTER — Other Ambulatory Visit: Payer: Self-pay | Admitting: Gynecology

## 2013-09-25 DIAGNOSIS — R928 Other abnormal and inconclusive findings on diagnostic imaging of breast: Secondary | ICD-10-CM

## 2013-10-04 ENCOUNTER — Other Ambulatory Visit: Payer: BC Managed Care – PPO

## 2013-10-11 ENCOUNTER — Ambulatory Visit
Admission: RE | Admit: 2013-10-11 | Discharge: 2013-10-11 | Disposition: A | Discharge status: Home / Self Care | Payer: BC Managed Care – PPO | Source: Ambulatory Visit | Attending: Gynecology | Admitting: Gynecology

## 2013-10-11 ENCOUNTER — Other Ambulatory Visit: Payer: Self-pay | Admitting: Gynecology

## 2013-10-11 DIAGNOSIS — R928 Other abnormal and inconclusive findings on diagnostic imaging of breast: Secondary | ICD-10-CM

## 2014-04-07 ENCOUNTER — Encounter: Payer: Self-pay | Admitting: Gynecology

## 2014-06-02 ENCOUNTER — Other Ambulatory Visit: Payer: Self-pay | Admitting: Gynecology

## 2014-07-21 ENCOUNTER — Encounter: Payer: BC Managed Care – PPO | Admitting: Gynecology

## 2014-07-23 ENCOUNTER — Encounter: Payer: BC Managed Care – PPO | Admitting: Gynecology

## 2014-08-07 ENCOUNTER — Telehealth: Payer: Self-pay | Admitting: *Deleted

## 2014-08-07 ENCOUNTER — Other Ambulatory Visit: Payer: Self-pay | Admitting: Gynecology

## 2014-08-07 MED ORDER — NORGESTIMATE-ETH ESTRADIOL 0.25-35 MG-MCG PO TABS: 1.0000 | ORAL_TABLET | Freq: Every day | ORAL | Status: DC

## 2014-08-07 NOTE — Telephone Encounter (Signed)
Pt has annual scheduled on 08/14/14 lost her last pack of pills, needs 1 pack sent to pharmacy. Will send rx.

## 2014-08-14 ENCOUNTER — Encounter: Payer: Self-pay | Admitting: Gynecology

## 2014-08-14 ENCOUNTER — Ambulatory Visit (INDEPENDENT_AMBULATORY_CARE_PROVIDER_SITE_OTHER): Payer: BLUE CROSS/BLUE SHIELD | Admitting: Gynecology

## 2014-08-14 VITALS — BP 110/60 | Ht 64.0 in | Wt 137.0 lb

## 2014-08-14 DIAGNOSIS — N8111 Cystocele, midline: Secondary | ICD-10-CM | POA: Diagnosis not present

## 2014-08-14 DIAGNOSIS — Z01419 Encounter for gynecological examination (general) (routine) without abnormal findings: Secondary | ICD-10-CM

## 2014-08-14 DIAGNOSIS — N814 Uterovaginal prolapse, unspecified: Secondary | ICD-10-CM | POA: Diagnosis not present

## 2014-08-14 MED ORDER — NORGESTIMATE-ETH ESTRADIOL 0.25-35 MG-MCG PO TABS: 1.0000 | ORAL_TABLET | Freq: Every day | ORAL | Status: DC

## 2014-08-14 NOTE — Progress Notes (Signed)
Chloe Valdez 09/19/1975 [id number]        39 y.o.  G2P2 for annual exam. Several issues noted below.  Past medical history,surgical history, problem list, medications, allergies, family history and social history were all reviewed and documented as reviewed in the EPIC chart.  ROS:  Performed with pertinent positives and negatives included in the history, assessment and plan.   Additional significant findings :  none   Exam: Kim Counsellor Vitals:   08/14/14 1524  BP: 110/60  Height: 5\' 4"  (1.626 m)  Weight: 137 lb (62.143 kg)   General appearance:  Normal affect, orientation and appearance. Skin: Grossly normal HEENT: Without gross lesions.  No cervical or supraclavicular adenopathy. Thyroid normal.  Lungs:  Clear without wheezing, rales or rhonchi Cardiac: RR, without RMG Abdominal:  Soft, nontender, without masses, guarding, rebound, organomegaly or hernia Breasts:  Examined lying and sitting without masses, retractions, discharge or axillary adenopathy. Pelvic:  Ext/BUS/vagina normal with mild cystocele noted.  Cervix normal. Mild uterine prolapse noted.  Uterus anteverted, normal size, shape and contour, midline and mobile nontender   Adnexa  Without masses or tenderness    Anus and perineum  Normal   Rectovaginal  Normal sphincter tone without palpated masses or tenderness.    Assessment/Plan:  39 y.o. G2P2 female for annual exam with regular menses, oral contraceptives.   1. Oral contraceptives. Patient continues on Dobbins doing well. Not being followed for medical issues and never smoker. Patient wants to continue. Increased risk of stroke heart attack DVT reviewed and accepted. Refill 1 year provided. 2. Mild cystocele/uterine prolapse. Patient asymptomatic without urinary issues or pelvic pressure. Options to include observation versus surgery discussed. Patient's comfortable with following and will call if any issues. 3. Maternal history of breast cancer  in her 22s. Patient's mother is alive and well at this point. No other family history of breast cancer or ovarian cancer. Patient was to schedule an appointment with genetic counselor last year but changed her mind. I again reviewed the issues of genetic counseling and determining her risk of breast cancer. If increased risk options to include adding MRI to her screening up to including prophylactic mastectomy. If genetic positive the increased risk of ovarian cancer also discussed and options of increased surveillance versus sublingual oophorectomy also reviewed. Patient wants to discuss with her husband again and will call if she wants to arrange genetic counseling but at this point declines. 4. Pap smear/HPV negative 2015.  No Pap smear done today. No history of significant abnormal Pap smears. Plan repeat Pap smear at 3-5 year interval per current screening guidelines. 5. Health maintenance. History of mild anemia last year with hemoglobin 10.4. Took iron for a while but has stopped it. Cholesterol 214 and LDL 117. HDL 70. Repeat CBC, comprehensive metabolic panel lipid profile urinalysis now.     Anastasio Auerbach MD, 3:58 PM 08/14/2014

## 2014-08-14 NOTE — Patient Instructions (Signed)
Call to arrange appointment with genetic counselor in reference to breast cancer risk if you want to proceed with this.  Schedule your mammography in April or May when due.  You may obtain a copy of any labs that were done today by logging onto MyChart as outlined in the instructions provided with your AVS (after visit summary). The office will not call with normal lab results but certainly if there are any significant abnormalities then we will contact you.   Health Maintenance, Female A healthy lifestyle and preventative care can promote health and wellness.  Maintain regular health, dental, and eye exams.  Eat a healthy diet. Foods like vegetables, fruits, whole grains, low-fat dairy products, and lean protein foods contain the nutrients you need without too many calories. Decrease your intake of foods high in solid fats, added sugars, and salt. Get information about a proper diet from your caregiver, if necessary.  Regular physical exercise is one of the most important things you can do for your health. Most adults should get at least 150 minutes of moderate-intensity exercise (any activity that increases your heart rate and causes you to sweat) each week. In addition, most adults need muscle-strengthening exercises on 2 or more days a week.   Maintain a healthy weight. The body mass index (BMI) is a screening tool to identify possible weight problems. It provides an estimate of body fat based on height and weight. Your caregiver can help determine your BMI, and can help you achieve or maintain a healthy weight. For adults 20 years and older:  A BMI below 18.5 is considered underweight.  A BMI of 18.5 to 24.9 is normal.  A BMI of 25 to 29.9 is considered overweight.  A BMI of 30 and above is considered obese.  Maintain normal blood lipids and cholesterol by exercising and minimizing your intake of saturated fat. Eat a balanced diet with plenty of fruits and vegetables. Blood tests for  lipids and cholesterol should begin at age 27 and be repeated every 5 years. If your lipid or cholesterol levels are high, you are over 50, or you are a high risk for heart disease, you may need your cholesterol levels checked more frequently.Ongoing high lipid and cholesterol levels should be treated with medicines if diet and exercise are not effective.  If you smoke, find out from your caregiver how to quit. If you do not use tobacco, do not start.  Lung cancer screening is recommended for adults aged 108 80 years who are at high risk for developing lung cancer because of a history of smoking. Yearly low-dose computed tomography (CT) is recommended for people who have at least a 30-pack-year history of smoking and are a current smoker or have quit within the past 15 years. A pack year of smoking is smoking an average of 1 pack of cigarettes a day for 1 year (for example: 1 pack a day for 30 years or 2 packs a day for 15 years). Yearly screening should continue until the smoker has stopped smoking for at least 15 years. Yearly screening should also be stopped for people who develop a health problem that would prevent them from having lung cancer treatment.  If you are pregnant, do not drink alcohol. If you are breastfeeding, be very cautious about drinking alcohol. If you are not pregnant and choose to drink alcohol, do not exceed 1 drink per day. One drink is considered to be 12 ounces (355 mL) of beer, 5 ounces (148 mL) of  wine, or 1.5 ounces (44 mL) of liquor.  Avoid use of street drugs. Do not share needles with anyone. Ask for help if you need support or instructions about stopping the use of drugs.  High blood pressure causes heart disease and increases the risk of stroke. Blood pressure should be checked at least every 1 to 2 years. Ongoing high blood pressure should be treated with medicines, if weight loss and exercise are not effective.  If you are 15 to 37 years old, ask your caregiver if  you should take aspirin to prevent strokes.  Diabetes screening involves taking a blood sample to check your fasting blood sugar level. This should be done once every 3 years, after age 36, if you are within normal weight and without risk factors for diabetes. Testing should be considered at a younger age or be carried out more frequently if you are overweight and have at least 1 risk factor for diabetes.  Breast cancer screening is essential preventative care for women. You should practice "breast self-awareness." This means understanding the normal appearance and feel of your breasts and may include breast self-examination. Any changes detected, no matter how small, should be reported to a caregiver. Women in their 60s and 30s should have a clinical breast exam (CBE) by a caregiver as part of a regular health exam every 1 to 3 years. After age 57, women should have a CBE every year. Starting at age 32, women should consider having a mammogram (breast X-ray) every year. Women who have a family history of breast cancer should talk to their caregiver about genetic screening. Women at a high risk of breast cancer should talk to their caregiver about having an MRI and a mammogram every year.  Breast cancer gene (BRCA)-related cancer risk assessment is recommended for women who have family members with BRCA-related cancers. BRCA-related cancers include breast, ovarian, tubal, and peritoneal cancers. Having family members with these cancers may be associated with an increased risk for harmful changes (mutations) in the breast cancer genes BRCA1 and BRCA2. Results of the assessment will determine the need for genetic counseling and BRCA1 and BRCA2 testing.  The Pap test is a screening test for cervical cancer. Women should have a Pap test starting at age 55. Between ages 1 and 28, Pap tests should be repeated every 2 years. Beginning at age 9, you should have a Pap test every 3 years as long as the past 3 Pap  tests have been normal. If you had a hysterectomy for a problem that was not cancer or a condition that could lead to cancer, then you no longer need Pap tests. If you are between ages 39 and 37, and you have had normal Pap tests going back 10 years, you no longer need Pap tests. If you have had past treatment for cervical cancer or a condition that could lead to cancer, you need Pap tests and screening for cancer for at least 20 years after your treatment. If Pap tests have been discontinued, risk factors (such as a new sexual partner) need to be reassessed to determine if screening should be resumed. Some women have medical problems that increase the chance of getting cervical cancer. In these cases, your caregiver may recommend more frequent screening and Pap tests.  The human papillomavirus (HPV) test is an additional test that may be used for cervical cancer screening. The HPV test looks for the virus that can cause the cell changes on the cervix. The cells collected during  the Pap test can be tested for HPV. The HPV test could be used to screen women aged 73 years and older, and should be used in women of any age who have unclear Pap test results. After the age of 59, women should have HPV testing at the same frequency as a Pap test.  Colorectal cancer can be detected and often prevented. Most routine colorectal cancer screening begins at the age of 57 and continues through age 39. However, your caregiver may recommend screening at an earlier age if you have risk factors for colon cancer. On a yearly basis, your caregiver may provide home test kits to check for hidden blood in the stool. Use of a small camera at the end of a tube, to directly examine the colon (sigmoidoscopy or colonoscopy), can detect the earliest forms of colorectal cancer. Talk to your caregiver about this at age 41, when routine screening begins. Direct examination of the colon should be repeated every 5 to 10 years through age 91,  unless early forms of pre-cancerous polyps or small growths are found.  Hepatitis C blood testing is recommended for all people born from [birth year] through 1963 and any individual with known risks for hepatitis C.  Practice safe sex. Use condoms and avoid high-risk sexual practices to reduce the spread of sexually transmitted infections (STIs). Sexually active women aged 83 and younger should be checked for Chlamydia, which is a common sexually transmitted infection. Older women with new or multiple partners should also be tested for Chlamydia. Testing for other STIs is recommended if you are sexually active and at increased risk.  Osteoporosis is a disease in which the bones lose minerals and strength with aging. This can result in serious bone fractures. The risk of osteoporosis can be identified using a bone density scan. Women ages 54 and over and women at risk for fractures or osteoporosis should discuss screening with their caregivers. Ask your caregiver whether you should be taking a calcium supplement or vitamin D to reduce the rate of osteoporosis.  Menopause can be associated with physical symptoms and risks. Hormone replacement therapy is available to decrease symptoms and risks. You should talk to your caregiver about whether hormone replacement therapy is right for you.  Use sunscreen. Apply sunscreen liberally and repeatedly throughout the day. You should seek shade when your shadow is shorter than you. Protect yourself by wearing long sleeves, pants, a wide-brimmed hat, and sunglasses year round, whenever you are outdoors.  Notify your caregiver of new moles or changes in moles, especially if there is a change in shape or color. Also notify your caregiver if a mole is larger than the size of a pencil eraser.  Stay current with your immunizations. Document Released: 12/06/2010 Document Revised: 09/17/2012 Document Reviewed: 12/06/2010 [facility] Patient Information 2014 [place].

## 2014-08-15 ENCOUNTER — Telehealth: Payer: Self-pay

## 2014-08-15 LAB — URINALYSIS W MICROSCOPIC + REFLEX CULTURE
BILIRUBIN URINE: NEGATIVE
Bacteria, UA: NONE SEEN
Casts: NONE SEEN
Crystals: NONE SEEN
Glucose, UA: NEGATIVE mg/dL
HGB URINE DIPSTICK: NEGATIVE
Ketones, ur: NEGATIVE mg/dL
LEUKOCYTES UA: NEGATIVE
Nitrite: NEGATIVE
Protein, ur: NEGATIVE mg/dL
SQUAMOUS EPITHELIAL / LPF: NONE SEEN
Specific Gravity, Urine: 1.008 (ref 1.005–1.030)
UROBILINOGEN UA: 0.2 mg/dL (ref 0.0–1.0)
pH: 5.5 (ref 5.0–8.0)

## 2014-08-15 NOTE — Telephone Encounter (Signed)
I contacted patient at the lab's request. There was a mixup in specimens and they need for the patient to return to have her blood sample redrawn. I assured her no additional charge at all per lab.  Lab appt made for Monday morning.

## 2014-08-18 ENCOUNTER — Other Ambulatory Visit: Payer: BLUE CROSS/BLUE SHIELD

## 2014-08-18 LAB — CBC WITH DIFFERENTIAL/PLATELET
BASOS ABS: 0 10*3/uL (ref 0.0–0.1)
BASOS PCT: 0 % (ref 0–1)
EOS PCT: 2 % (ref 0–5)
Eosinophils Absolute: 0.1 10*3/uL (ref 0.0–0.7)
HEMATOCRIT: 39.1 % (ref 36.0–46.0)
HEMOGLOBIN: 13.2 g/dL (ref 12.0–15.0)
Lymphocytes Relative: 30 % (ref 12–46)
Lymphs Abs: 2 10*3/uL (ref 0.7–4.0)
MCH: 28.3 pg (ref 26.0–34.0)
MCHC: 33.8 g/dL (ref 30.0–36.0)
MCV: 83.9 fL (ref 78.0–100.0)
MONO ABS: 0.5 10*3/uL (ref 0.1–1.0)
MPV: 11.7 fL (ref 8.6–12.4)
Monocytes Relative: 8 % (ref 3–12)
NEUTROS ABS: 4 10*3/uL (ref 1.7–7.7)
Neutrophils Relative %: 60 % (ref 43–77)
Platelets: 204 10*3/uL (ref 150–400)
RBC: 4.66 MIL/uL (ref 3.87–5.11)
RDW: 14.4 % (ref 11.5–15.5)
WBC: 6.6 10*3/uL (ref 4.0–10.5)

## 2014-08-18 LAB — COMPREHENSIVE METABOLIC PANEL
ALBUMIN: 3.9 g/dL (ref 3.5–5.2)
ALK PHOS: 43 U/L (ref 39–117)
ALT: 9 U/L (ref 0–35)
AST: 16 U/L (ref 0–37)
BILIRUBIN TOTAL: 0.4 mg/dL (ref 0.2–1.2)
BUN: 10 mg/dL (ref 6–23)
CO2: 23 mEq/L (ref 19–32)
Calcium: 8.9 mg/dL (ref 8.4–10.5)
Chloride: 107 mEq/L (ref 96–112)
Creat: 0.67 mg/dL (ref 0.50–1.10)
Glucose, Bld: 82 mg/dL (ref 70–99)
POTASSIUM: 4.5 meq/L (ref 3.5–5.3)
SODIUM: 140 meq/L (ref 135–145)
Total Protein: 6.5 g/dL (ref 6.0–8.3)

## 2014-08-18 LAB — LIPID PANEL
CHOL/HDL RATIO: 3.2 ratio
CHOLESTEROL: 204 mg/dL — AB (ref 0–200)
HDL: 64 mg/dL (ref >=46)
LDL Cholesterol: 108 mg/dL — ABNORMAL HIGH (ref 0–99)
Triglycerides: 158 mg/dL — ABNORMAL HIGH (ref <150)
VLDL: 32 mg/dL (ref 0–40)

## 2014-10-23 ENCOUNTER — Other Ambulatory Visit: Payer: Self-pay

## 2014-10-23 DIAGNOSIS — Z1231 Encounter for screening mammogram for malignant neoplasm of breast: Secondary | ICD-10-CM

## 2014-11-19 ENCOUNTER — Ambulatory Visit
Admission: RE | Admit: 2014-11-19 | Discharge: 2014-11-19 | Disposition: A | Discharge status: Home / Self Care | Payer: BLUE CROSS/BLUE SHIELD | Source: Ambulatory Visit

## 2014-11-19 DIAGNOSIS — Z1231 Encounter for screening mammogram for malignant neoplasm of breast: Secondary | ICD-10-CM

## 2015-08-17 ENCOUNTER — Encounter: Payer: BLUE CROSS/BLUE SHIELD | Admitting: Gynecology

## 2015-08-19 ENCOUNTER — Encounter: Payer: BLUE CROSS/BLUE SHIELD | Admitting: Gynecology

## 2015-08-31 ENCOUNTER — Other Ambulatory Visit: Payer: Self-pay | Admitting: Gynecology

## 2015-09-04 ENCOUNTER — Encounter: Payer: BLUE CROSS/BLUE SHIELD | Admitting: Gynecology

## 2015-09-30 ENCOUNTER — Other Ambulatory Visit: Payer: Self-pay | Admitting: Gynecology

## 2015-10-12 ENCOUNTER — Encounter: Payer: Self-pay | Admitting: Gynecology

## 2015-10-12 ENCOUNTER — Ambulatory Visit (INDEPENDENT_AMBULATORY_CARE_PROVIDER_SITE_OTHER): Payer: 59 | Admitting: Gynecology

## 2015-10-12 VITALS — BP 112/66 | Ht 63.0 in | Wt 121.0 lb

## 2015-10-12 DIAGNOSIS — Z01419 Encounter for gynecological examination (general) (routine) without abnormal findings: Secondary | ICD-10-CM

## 2015-10-12 DIAGNOSIS — N814 Uterovaginal prolapse, unspecified: Secondary | ICD-10-CM

## 2015-10-12 DIAGNOSIS — Z13220 Encounter for screening for lipoid disorders: Secondary | ICD-10-CM | POA: Diagnosis not present

## 2015-10-12 DIAGNOSIS — N8111 Cystocele, midline: Secondary | ICD-10-CM

## 2015-10-12 MED ORDER — NORGESTIMATE-ETH ESTRADIOL 0.25-35 MG-MCG PO TABS: 1.0000 | ORAL_TABLET | Freq: Every day | ORAL | Status: DC

## 2015-10-12 NOTE — Patient Instructions (Signed)

## 2015-10-12 NOTE — Progress Notes (Signed)
    THEADA POEPPELMAN 31-Jul-1975 [id number]        40 y.o.  G2P2  for annual exam.  Several issues noted below.  Past medical history,surgical history, problem list, medications, allergies, family history and social history were all reviewed and documented as reviewed in the EPIC chart.  ROS:  Performed with pertinent positives and negatives included in the history, assessment and plan.   Additional significant findings :  none   Exam: Caryn Bee assistant Filed Vitals:   10/12/15 1413  BP: 112/66  Height: 5\' 3"  (1.6 m)  Weight: 121 lb (54.885 kg)   General appearance:  Normal affect, orientation and appearance. Skin: Grossly normal HEENT: Without gross lesions.  No cervical or supraclavicular adenopathy. Thyroid normal.  Lungs:  Clear without wheezing, rales or rhonchi Cardiac: RR, without RMG Abdominal:  Soft, nontender, without masses, guarding, rebound, organomegaly or hernia Breasts:  Examined lying and sitting without masses, retractions, discharge or axillary adenopathy. Pelvic:  Ext/BUS/vagina with first-degree cystocele and first-degree uterine prolapse.  Cervix normal  Uterus anteverted, normal size, shape and contour, midline and mobile nontender   Adnexa without masses or tenderness    Anus and perineum normal   Rectovaginal normal sphincter tone without palpated masses or tenderness.    Assessment/Plan:  40 y.o. G2P2 female for annual exam with regular menses, oral contraceptives.   1. Doing well on Sprintec and wants to continue. Not being followed for any medical issues and never smoked. We have discussed the risks to include stroke heart attack DVT. Refill 1 year prescribed.  2. Mild cystocele and mild uterine prolapse. Patient's asymptomatic. Continue with observation and reporting any issues. 3. Maternal history of breast cancer in her 27s. Had arranged for genetic counseling last year but patient canceled and never followed up. She's decided she does not  want to do this. We discussed the issues that if she is genetic positive high risk of breast and ovarian cancer. Options for management to include MRIs, prophylactic mastectomies and prophylactic salpingo-oophorectomies also discussed. Patient at this point declines understanding the issues. I did strongly recommend she get her mother to be tested. 4. Pap smear/HPV 07/2013 negative. No Pap smear done today.no history of significant abnormal Pap smears. 5. Health maintenance. Future orders placed for CBC, CMP, lipid profile. Patient's going to return tomorrow fasting. Check urinalysis today. Follow up in one year, sooner as needed.   Anastasio Auerbach MD, 2:36 PM 10/12/2015

## 2015-10-13 ENCOUNTER — Other Ambulatory Visit: Payer: 59

## 2015-10-13 DIAGNOSIS — Z13220 Encounter for screening for lipoid disorders: Secondary | ICD-10-CM

## 2015-10-13 DIAGNOSIS — Z01419 Encounter for gynecological examination (general) (routine) without abnormal findings: Secondary | ICD-10-CM

## 2015-10-13 LAB — CBC WITH DIFFERENTIAL/PLATELET
BASOS PCT: 0 %
Basophils Absolute: 0 cells/uL (ref 0–200)
EOS PCT: 3 %
Eosinophils Absolute: 252 cells/uL (ref 15–500)
HEMATOCRIT: 42 % (ref 35.0–45.0)
HEMOGLOBIN: 14.1 g/dL (ref 11.7–15.5)
LYMPHS ABS: 2604 {cells}/uL (ref 850–3900)
Lymphocytes Relative: 31 %
MCH: 30.1 pg (ref 27.0–33.0)
MCHC: 33.6 g/dL (ref 32.0–36.0)
MCV: 89.6 fL (ref 80.0–100.0)
MONO ABS: 504 {cells}/uL (ref 200–950)
MPV: 11.6 fL (ref 7.5–12.5)
Monocytes Relative: 6 %
NEUTROS ABS: 5040 {cells}/uL (ref 1500–7800)
NEUTROS PCT: 60 %
Platelets: 231 10*3/uL (ref 140–400)
RBC: 4.69 MIL/uL (ref 3.80–5.10)
RDW: 13.2 % (ref 11.0–15.0)
WBC: 8.4 10*3/uL (ref 3.8–10.8)

## 2015-10-13 LAB — LIPID PANEL
Cholesterol: 193 mg/dL (ref 125–200)
HDL: 62 mg/dL (ref >=46)
LDL CALC: 105 mg/dL (ref <130)
TRIGLYCERIDES: 130 mg/dL (ref <150)
Total CHOL/HDL Ratio: 3.1 Ratio (ref <=5.0)
VLDL: 26 mg/dL (ref <30)

## 2015-10-13 LAB — URINALYSIS W MICROSCOPIC + REFLEX CULTURE
BILIRUBIN URINE: NEGATIVE
Bacteria, UA: NONE SEEN [HPF]
CRYSTALS: NONE SEEN [HPF]
Casts: NONE SEEN [LPF]
Glucose, UA: NEGATIVE
Hgb urine dipstick: NEGATIVE
Ketones, ur: NEGATIVE
LEUKOCYTES UA: NEGATIVE
Nitrite: NEGATIVE
PROTEIN: NEGATIVE
RBC / HPF: NONE SEEN RBC/HPF (ref <=2)
SPECIFIC GRAVITY, URINE: 1.013 (ref 1.001–1.035)
Squamous Epithelial / LPF: NONE SEEN [HPF] (ref <=5)
WBC UA: NONE SEEN WBC/HPF (ref <=5)
YEAST: NONE SEEN [HPF]
pH: 6 (ref 5.0–8.0)

## 2015-10-13 LAB — COMPREHENSIVE METABOLIC PANEL
ALBUMIN: 3.8 g/dL (ref 3.6–5.1)
ALK PHOS: 44 U/L (ref 33–115)
ALT: 17 U/L (ref 6–29)
AST: 17 U/L (ref 10–30)
BILIRUBIN TOTAL: 0.4 mg/dL (ref 0.2–1.2)
BUN: 13 mg/dL (ref 7–25)
CALCIUM: 8.8 mg/dL (ref 8.6–10.2)
CO2: 22 mmol/L (ref 20–31)
CREATININE: 0.7 mg/dL (ref 0.50–1.10)
Chloride: 106 mmol/L (ref 98–110)
Glucose, Bld: 79 mg/dL (ref 65–99)
Potassium: 4.2 mmol/L (ref 3.5–5.3)
SODIUM: 138 mmol/L (ref 135–146)
TOTAL PROTEIN: 6.8 g/dL (ref 6.1–8.1)

## 2016-10-12 ENCOUNTER — Ambulatory Visit (INDEPENDENT_AMBULATORY_CARE_PROVIDER_SITE_OTHER): Payer: 59 | Admitting: Gynecology

## 2016-10-12 ENCOUNTER — Encounter: Payer: Self-pay | Admitting: Gynecology

## 2016-10-12 VITALS — BP 114/70 | Ht 63.0 in | Wt 125.0 lb

## 2016-10-12 DIAGNOSIS — Z01419 Encounter for gynecological examination (general) (routine) without abnormal findings: Secondary | ICD-10-CM

## 2016-10-12 DIAGNOSIS — Z13220 Encounter for screening for lipoid disorders: Secondary | ICD-10-CM

## 2016-10-12 MED ORDER — NORGESTIMATE-ETH ESTRADIOL 0.25-35 MG-MCG PO TABS: 1.0000 | ORAL_TABLET | Freq: Every day | ORAL | 12 refills | Status: DC

## 2016-10-12 NOTE — Progress Notes (Signed)
    Chloe Valdez 11-Sep-1975 [id number]        41 y.o.  G2P2 for annual exam.   Past medical history,surgical history, problem list, medications, allergies, family history and social history were all reviewed and documented as reviewed in the EPIC chart.  ROS:  Performed with pertinent positives and negatives included in the history, assessment and plan.   Additional significant findings :  None   Exam: Caryn Bee assistant Vitals:   10/12/16 1204  BP: 114/70  Weight: 125 lb (56.7 kg)  Height: 5\' 3"  (1.6 m)   Body mass index is 22.14 kg/m.  General appearance:  Normal affect, orientation and appearance. Skin: Grossly normal HEENT: Without gross lesions.  No cervical or supraclavicular adenopathy. Thyroid normal.  Lungs:  Clear without wheezing, rales or rhonchi Cardiac: RR, without RMG Abdominal:  Soft, nontender, without masses, guarding, rebound, organomegaly or hernia Breasts:  Examined lying and sitting without masses, retractions, discharge or axillary adenopathy. Pelvic:  Ext, BUS, Vagina: Normal with mild cystocele and mild uterine prolapse.  Cervix: Normal.  Uterus: Anteverted, normal size, shape and contour, midline and mobile nontender   Adnexa: Without masses or tenderness    Anus and perineum: Normal   Rectovaginal: Normal sphincter tone without palpated masses or tenderness.    Assessment/Plan:  41 y.o. G2P2 female for annual exam with regular menses, oral contraceptives.   1. Oral contraceptives. Patient on Sprintec equivalent and wants to continue. Reviewed thrombosis risk associated with the pill. Otherwise healthy never smoked. Refill 1 year provided. 2. Mild cystocele and uterine prolapse. Asymptomatic to the patient. Continue to monitor report any symptoms. 3. Maternal history of breast cancer in her 3s. Mother was never genetically tested. I have discussed this with the patient and arranged for genetic counseling 2016 but the patient called and  canceled the appointment. She does not want to pursue genetic counseling. She understands the risks if genetically positive for both breast and ovarian cancers. Also discussed increasing surveillance with annual mammography and possible MRIs. Issue of cost was also reviewed. At this point patient declines MRIs but will do annual mammography. She is overdue now and agrees to call and schedule this. SBE monthly reviewed. I encouraged patient to call me if she changes her mind to arrange for genetic counseling and testing. 4. Pap smear/HPV 07/2013 negative. No Pap smear done today. No history of significant abnormal Pap smears. Plan repeat Pap smear at 5 year interval per current screening guidelines. 5. Health maintenance. Patient's not fasting. Wants to return fasting for lab work. Future orders placed for CBC, CMP, lipid profile and urinalysis. Follow up in one year, sooner as needed.   Anastasio Auerbach MD, 12:23 PM 10/12/2016

## 2016-10-12 NOTE — Patient Instructions (Signed)
Return for fasting blood work Follow up in one year for annual exam Schedule your mammogram Call if you change your mind in reference to genetic counseling as far as breast cancer gene testing

## 2016-10-13 ENCOUNTER — Other Ambulatory Visit: Payer: 59

## 2016-10-13 DIAGNOSIS — Z01419 Encounter for gynecological examination (general) (routine) without abnormal findings: Secondary | ICD-10-CM

## 2016-10-13 DIAGNOSIS — Z13220 Encounter for screening for lipoid disorders: Secondary | ICD-10-CM

## 2016-10-13 LAB — CBC WITH DIFFERENTIAL/PLATELET
BASOS ABS: 0 {cells}/uL (ref 0–200)
Basophils Relative: 0 %
EOS ABS: 120 {cells}/uL (ref 15–500)
Eosinophils Relative: 2 %
HEMATOCRIT: 40.6 % (ref 35.0–45.0)
HEMOGLOBIN: 13.6 g/dL (ref 11.7–15.5)
LYMPHS PCT: 30 %
Lymphs Abs: 1800 cells/uL (ref 850–3900)
MCH: 30.4 pg (ref 27.0–33.0)
MCHC: 33.5 g/dL (ref 32.0–36.0)
MCV: 90.8 fL (ref 80.0–100.0)
MONO ABS: 480 {cells}/uL (ref 200–950)
MPV: 11.4 fL (ref 7.5–12.5)
Monocytes Relative: 8 %
NEUTROS PCT: 60 %
Neutro Abs: 3600 cells/uL (ref 1500–7800)
Platelets: 199 10*3/uL (ref 140–400)
RBC: 4.47 MIL/uL (ref 3.80–5.10)
RDW: 13 % (ref 11.0–15.0)
WBC: 6 10*3/uL (ref 3.8–10.8)

## 2016-10-13 LAB — URINALYSIS W MICROSCOPIC + REFLEX CULTURE
BILIRUBIN URINE: NEGATIVE
Bacteria, UA: NONE SEEN [HPF]
CRYSTALS: NONE SEEN [HPF]
Casts: NONE SEEN [LPF]
GLUCOSE, UA: NEGATIVE
Hgb urine dipstick: NEGATIVE
KETONES UR: NEGATIVE
LEUKOCYTES UA: NEGATIVE
Nitrite: NEGATIVE
PH: 6 (ref 5.0–8.0)
Protein, ur: NEGATIVE
RBC / HPF: NONE SEEN RBC/HPF (ref <=2)
Specific Gravity, Urine: 1.01 (ref 1.001–1.035)
Squamous Epithelial / LPF: NONE SEEN [HPF] (ref <=5)
WBC UA: NONE SEEN WBC/HPF (ref <=5)
Yeast: NONE SEEN [HPF]

## 2016-10-13 LAB — COMPREHENSIVE METABOLIC PANEL
ALBUMIN: 3.7 g/dL (ref 3.6–5.1)
ALK PHOS: 32 U/L — AB (ref 33–115)
ALT: 10 U/L (ref 6–29)
AST: 17 U/L (ref 10–30)
BUN: 10 mg/dL (ref 7–25)
CALCIUM: 8.7 mg/dL (ref 8.6–10.2)
CO2: 21 mmol/L (ref 20–31)
Chloride: 107 mmol/L (ref 98–110)
Creat: 0.74 mg/dL (ref 0.50–1.10)
Glucose, Bld: 90 mg/dL (ref 65–99)
POTASSIUM: 4.4 mmol/L (ref 3.5–5.3)
Sodium: 136 mmol/L (ref 135–146)
Total Bilirubin: 0.3 mg/dL (ref 0.2–1.2)
Total Protein: 6.2 g/dL (ref 6.1–8.1)

## 2016-10-13 LAB — LIPID PANEL
Cholesterol: 173 mg/dL (ref <200)
HDL: 71 mg/dL (ref >50)
LDL CALC: 84 mg/dL (ref <100)
TRIGLYCERIDES: 90 mg/dL (ref <150)
Total CHOL/HDL Ratio: 2.4 Ratio (ref <5.0)
VLDL: 18 mg/dL (ref <30)

## 2016-10-22 ENCOUNTER — Other Ambulatory Visit: Payer: Self-pay | Admitting: Gynecology

## 2017-09-21 ENCOUNTER — Other Ambulatory Visit: Payer: Self-pay | Admitting: Gynecology

## 2017-09-21 DIAGNOSIS — Z139 Encounter for screening, unspecified: Secondary | ICD-10-CM

## 2017-10-13 ENCOUNTER — Encounter: Payer: Self-pay | Admitting: Gynecology

## 2017-10-13 ENCOUNTER — Ambulatory Visit (INDEPENDENT_AMBULATORY_CARE_PROVIDER_SITE_OTHER): Payer: 59 | Admitting: Gynecology

## 2017-10-13 VITALS — BP 118/72 | Ht 63.0 in | Wt 128.0 lb

## 2017-10-13 DIAGNOSIS — Z13220 Encounter for screening for lipoid disorders: Secondary | ICD-10-CM | POA: Diagnosis not present

## 2017-10-13 DIAGNOSIS — Z01419 Encounter for gynecological examination (general) (routine) without abnormal findings: Secondary | ICD-10-CM | POA: Diagnosis not present

## 2017-10-13 DIAGNOSIS — Z1151 Encounter for screening for human papillomavirus (HPV): Secondary | ICD-10-CM | POA: Diagnosis not present

## 2017-10-13 MED ORDER — NORGESTIMATE-ETH ESTRADIOL 0.25-35 MG-MCG PO TABS: 1.0000 | ORAL_TABLET | Freq: Every day | ORAL | 12 refills | Status: DC

## 2017-10-13 NOTE — Progress Notes (Signed)
    Chloe Valdez 06/12/1975 [id number]        42 y.o.  G2P2 for annual gynecologic exam.  Without gynecologic complaints.  Past medical history,surgical history, problem list, medications, allergies, family history and social history were all reviewed and documented as reviewed in the EPIC chart.  ROS:  Performed with pertinent positives and negatives included in the history, assessment and plan.   Additional significant findings : None   Exam: Chloe Valdez assistant Vitals:   10/13/17 1547  BP: 118/72  Weight: 128 lb (58.1 kg)  Height: 5\' 3"  (1.6 m)   Body mass index is 22.67 kg/m.  General appearance:  Normal affect, orientation and appearance. Skin: Grossly normal HEENT: Without gross lesions.  No cervical or supraclavicular adenopathy. Thyroid normal.  Lungs:  Clear without wheezing, rales or rhonchi Cardiac: RR, without RMG Abdominal:  Soft, nontender, without masses, guarding, rebound, organomegaly or hernia Breasts:  Examined lying and sitting without masses, retractions, discharge or axillary adenopathy. Pelvic:  Ext, BUS, Vagina: Normal with mild cystocele and mild uterine prolapse  Cervix: Normal  Uterus: Anteverted, normal size, shape and contour, midline and mobile nontender   Adnexa: Without masses or tenderness    Anus and perineum: Normal   Rectovaginal: Normal sphincter tone without palpated masses or tenderness.    Assessment/Plan:  42 y.o. G2P2 female for annual gynecologic exam with regular menses, oral contraceptives.   1. Oral contraceptives.  Continues on Sprintec equivalents.  Doing well and wants to continue.  We again reviewed the risks to include thrombosis.  Patient understands and accepts.  Refill x1 year provided. 2. Mild cystocele and uterine prolapse.  Stable on serial exams.  Asymptomatic to the patient. 3. Pap smear/HPV 07/2013.  Pap smear/HPV done today.  No history of abnormal Pap smears previously. 4. Mammography scheduled and she  will follow-up for this.  Breast exam normal today.  We again discussed her family history and she is not interested in genetic counseling or MRIs. 5. Health maintenance.  Future orders for CBC, CMP and lipid profile placed.  She will follow-up fasting for these.  She will follow-up in 1 year for annual exam.   Anastasio Auerbach MD, 4:14 PM 10/13/2017

## 2017-10-13 NOTE — Addendum Note (Signed)
Addended by: Nelva Nay on: 10/13/2017 04:24 PM   Modules accepted: Orders

## 2017-10-13 NOTE — Patient Instructions (Signed)
Follow-up in 1 year for annual exam, sooner as needed. 

## 2017-10-16 ENCOUNTER — Ambulatory Visit
Admission: RE | Admit: 2017-10-16 | Discharge: 2017-10-16 | Disposition: A | Discharge status: Home / Self Care | Payer: 59 | Source: Ambulatory Visit | Attending: Gynecology | Admitting: Gynecology

## 2017-10-16 DIAGNOSIS — Z139 Encounter for screening, unspecified: Secondary | ICD-10-CM

## 2017-10-17 LAB — PAP IG AND HPV HIGH-RISK: HPV DNA High Risk: NOT DETECTED

## 2017-10-18 LAB — URINALYSIS, COMPLETE W/RFL CULTURE
BILIRUBIN URINE: NEGATIVE
Bacteria, UA: NONE SEEN /HPF
GLUCOSE, UA: NEGATIVE
HYALINE CAST: NONE SEEN /LPF
Hgb urine dipstick: NEGATIVE
NITRITES URINE, INITIAL: NEGATIVE
Protein, ur: NEGATIVE
RBC / HPF: NONE SEEN /HPF (ref 0–2)
SPECIFIC GRAVITY, URINE: 1.023 (ref 1.001–1.03)
Squamous Epithelial / LPF: NONE SEEN /HPF (ref <=5)

## 2017-10-18 LAB — CULTURE INDICATED

## 2017-10-18 LAB — URINE CULTURE
MICRO NUMBER: 90573193
SPECIMEN QUALITY:: ADEQUATE

## 2018-10-17 ENCOUNTER — Encounter: Payer: Self-pay | Admitting: Gynecology

## 2018-10-17 ENCOUNTER — Ambulatory Visit (INDEPENDENT_AMBULATORY_CARE_PROVIDER_SITE_OTHER): Payer: 59 | Admitting: Gynecology

## 2018-10-17 ENCOUNTER — Other Ambulatory Visit: Payer: Self-pay

## 2018-10-17 VITALS — BP 116/72 | Ht 63.0 in | Wt 136.0 lb

## 2018-10-17 DIAGNOSIS — Z01419 Encounter for gynecological examination (general) (routine) without abnormal findings: Secondary | ICD-10-CM

## 2018-10-17 DIAGNOSIS — Z13220 Encounter for screening for lipoid disorders: Secondary | ICD-10-CM

## 2018-10-17 DIAGNOSIS — N8189 Other female genital prolapse: Secondary | ICD-10-CM

## 2018-10-17 LAB — COMPREHENSIVE METABOLIC PANEL
AG Ratio: 1.4 (calc) (ref 1.0–2.5)
ALT: 8 U/L (ref 6–29)
AST: 13 U/L (ref 10–30)
Albumin: 4 g/dL (ref 3.6–5.1)
Alkaline phosphatase (APISO): 43 U/L (ref 31–125)
BUN: 11 mg/dL (ref 7–25)
CO2: 22 mmol/L (ref 20–32)
Calcium: 9.1 mg/dL (ref 8.6–10.2)
Chloride: 106 mmol/L (ref 98–110)
Creat: 0.63 mg/dL (ref 0.50–1.10)
Globulin: 2.9 g/dL (calc) (ref 1.9–3.7)
Glucose, Bld: 85 mg/dL (ref 65–99)
Potassium: 4.3 mmol/L (ref 3.5–5.3)
Sodium: 136 mmol/L (ref 135–146)
Total Bilirubin: 0.4 mg/dL (ref 0.2–1.2)
Total Protein: 6.9 g/dL (ref 6.1–8.1)

## 2018-10-17 LAB — CBC WITH DIFFERENTIAL/PLATELET
Absolute Monocytes: 567 cells/uL (ref 200–950)
Basophils Absolute: 41 cells/uL (ref 0–200)
Basophils Relative: 0.5 %
Eosinophils Absolute: 113 cells/uL (ref 15–500)
Eosinophils Relative: 1.4 %
HCT: 38 % (ref 35.0–45.0)
Hemoglobin: 12.5 g/dL (ref 11.7–15.5)
Lymphs Abs: 2049 cells/uL (ref 850–3900)
MCH: 29.3 pg (ref 27.0–33.0)
MCHC: 32.9 g/dL (ref 32.0–36.0)
MCV: 89 fL (ref 80.0–100.0)
MPV: 12.6 fL — ABNORMAL HIGH (ref 7.5–12.5)
Monocytes Relative: 7 %
Neutro Abs: 5330 cells/uL (ref 1500–7800)
Neutrophils Relative %: 65.8 %
Platelets: 235 10*3/uL (ref 140–400)
RBC: 4.27 10*6/uL (ref 3.80–5.10)
RDW: 11.8 % (ref 11.0–15.0)
Total Lymphocyte: 25.3 %
WBC: 8.1 10*3/uL (ref 3.8–10.8)

## 2018-10-17 LAB — LIPID PANEL
Cholesterol: 233 mg/dL — ABNORMAL HIGH (ref <200)
HDL: 77 mg/dL (ref >=50)
LDL Cholesterol (Calc): 131 mg/dL (calc) — ABNORMAL HIGH
Non-HDL Cholesterol (Calc): 156 mg/dL (calc) — ABNORMAL HIGH (ref <130)
Total CHOL/HDL Ratio: 3 (calc) (ref <5.0)
Triglycerides: 130 mg/dL (ref <150)

## 2018-10-17 MED ORDER — NORGESTIMATE-ETH ESTRADIOL 0.25-35 MG-MCG PO TABS: 1.0000 | ORAL_TABLET | Freq: Every day | ORAL | 4 refills | Status: DC

## 2018-10-17 NOTE — Progress Notes (Signed)
    Chloe Valdez 23-Jul-1975 [id number]        43 y.o.  G2P2 for annual gynecologic exam.  Without gynecologic complaints  Past medical history,surgical history, problem list, medications, allergies, family history and social history were all reviewed and documented as reviewed in the EPIC chart.  ROS:  Performed with pertinent positives and negatives included in the history, assessment and plan.   Additional significant findings : None   Exam: Caryn Bee assistant Vitals:   10/17/18 0759  BP: 116/72  Weight: 136 lb (61.7 kg)  Height: 5\' 3"  (1.6 m)   Body mass index is 24.09 kg/m.  General appearance:  Normal affect, orientation and appearance. Skin: Grossly normal HEENT: Without gross lesions.  No cervical or supraclavicular adenopathy. Thyroid normal.  Lungs:  Clear without wheezing, rales or rhonchi Cardiac: RR, without RMG Abdominal:  Soft, nontender, without masses, guarding, rebound, organomegaly or hernia Breasts:  Examined lying and sitting without masses, retractions, discharge or axillary adenopathy. Pelvic:  Ext, BUS, Vagina: With mild cystocele and uterine prolapse.  Stable from prior exam  Cervix: Normal  Uterus: Anteverted, normal size, shape and contour, midline and mobile nontender   Adnexa: Without masses or tenderness    Anus and perineum: Normal   Rectovaginal: Normal sphincter tone without palpated masses or tenderness.    Assessment/Plan:  43 y.o. G2P2 female for annual gynecologic exam.  With regular menses, oral contraceptives  1. Oral contraceptives.  Doing well on Sprintec equivalent.  We again discussed risks to include thrombosis.  She is comfortable with this and I refilled her x1 year. 2. Mammography due now and she is calling to make an appointment.  Breast exam normal today. 3. Pap smear/HPV 10/2017.  No Pap smear done today.  No history of abnormal Pap smears.  Plan repeat Pap smear/HPV at 5-year interval per current screening  guidelines. 4. Mild cystocele/uterine prolapse.  Stable on serial exams.  Asymptomatic to the patient.  Continue to monitor and report any symptoms. 5. Health maintenance.  Baseline CBC, CMP and lipid profile ordered.  Follow-up 1 year, sooner as needed.   Anastasio Auerbach MD, 8:34 AM 10/17/2018

## 2018-10-17 NOTE — Patient Instructions (Signed)
Follow-up in 1 year for annual exam, sooner as needed. 

## 2018-10-19 ENCOUNTER — Encounter: Payer: 59 | Admitting: Gynecology

## 2018-12-31 ENCOUNTER — Other Ambulatory Visit: Payer: Self-pay | Admitting: Gynecology

## 2018-12-31 DIAGNOSIS — Z1231 Encounter for screening mammogram for malignant neoplasm of breast: Secondary | ICD-10-CM

## 2019-01-01 ENCOUNTER — Other Ambulatory Visit: Payer: Self-pay

## 2019-01-01 ENCOUNTER — Ambulatory Visit
Admission: RE | Admit: 2019-01-01 | Discharge: 2019-01-01 | Disposition: A | Discharge status: Home / Self Care | Payer: 59 | Source: Ambulatory Visit | Attending: Gynecology | Admitting: Gynecology

## 2019-01-01 DIAGNOSIS — Z1231 Encounter for screening mammogram for malignant neoplasm of breast: Secondary | ICD-10-CM

## 2019-01-02 ENCOUNTER — Other Ambulatory Visit: Payer: Self-pay | Admitting: Gynecology

## 2019-01-02 DIAGNOSIS — R928 Other abnormal and inconclusive findings on diagnostic imaging of breast: Secondary | ICD-10-CM

## 2019-01-09 ENCOUNTER — Other Ambulatory Visit: Payer: Self-pay

## 2019-01-09 ENCOUNTER — Ambulatory Visit
Admission: RE | Admit: 2019-01-09 | Discharge: 2019-01-09 | Disposition: A | Discharge status: Home / Self Care | Payer: 59 | Source: Ambulatory Visit | Attending: Gynecology | Admitting: Gynecology

## 2019-01-09 DIAGNOSIS — R928 Other abnormal and inconclusive findings on diagnostic imaging of breast: Secondary | ICD-10-CM

## 2019-02-26 ENCOUNTER — Encounter: Payer: Self-pay | Admitting: Gynecology

## 2019-10-18 ENCOUNTER — Encounter: Payer: 59 | Admitting: Obstetrics and Gynecology

## 2019-10-24 ENCOUNTER — Encounter: Payer: 59 | Admitting: Obstetrics and Gynecology

## 2019-11-01 ENCOUNTER — Other Ambulatory Visit: Payer: Self-pay

## 2019-11-05 ENCOUNTER — Encounter: Payer: Self-pay | Admitting: Obstetrics and Gynecology

## 2019-11-05 ENCOUNTER — Ambulatory Visit (INDEPENDENT_AMBULATORY_CARE_PROVIDER_SITE_OTHER): Payer: 59 | Admitting: Obstetrics and Gynecology

## 2019-11-05 ENCOUNTER — Other Ambulatory Visit: Payer: Self-pay

## 2019-11-05 VITALS — BP 118/74 | Ht 63.5 in | Wt 137.0 lb

## 2019-11-05 DIAGNOSIS — Z13220 Encounter for screening for lipoid disorders: Secondary | ICD-10-CM

## 2019-11-05 DIAGNOSIS — Z01419 Encounter for gynecological examination (general) (routine) without abnormal findings: Secondary | ICD-10-CM

## 2019-11-05 DIAGNOSIS — N8111 Cystocele, midline: Secondary | ICD-10-CM

## 2019-11-05 DIAGNOSIS — N8189 Other female genital prolapse: Secondary | ICD-10-CM

## 2019-11-05 MED ORDER — NORGESTIMATE-ETH ESTRADIOL 0.25-35 MG-MCG PO TABS: 1.0000 | ORAL_TABLET | Freq: Every day | ORAL | 4 refills | Status: DC

## 2019-11-05 NOTE — Progress Notes (Signed)
   Chloe Valdez 1976-03-01 [id number]  SUBJECTIVE:  44 y.o. G2P2 female for annual routine gynecologic exam. She has no gynecologic concerns.  Current Outpatient Medications  Medication Sig Dispense Refill  . KRILL OIL PO Take by mouth.    . levocetirizine (XYZAL) 5 MG tablet Take 5 mg by mouth every evening.    . norgestimate-ethinyl estradiol (SPRINTEC 28) 0.25-35 MG-MCG tablet Take 1 tablet by mouth daily. 3 Package 4   No current facility-administered medications for this visit.   Allergies: Sulfa antibiotics  Patient's last menstrual period was 10/24/2019.  Past medical history,surgical history, problem list, medications, allergies, family history and social history were all reviewed and documented as reviewed in the EPIC chart.  ROS:  Feeling well. No dyspnea or chest pain on exertion.  No abdominal pain, change in bowel habits, black or bloody stools.  No urinary tract symptoms. GYN ROS: normal menses, no abnormal bleeding, pelvic pain or discharge, no breast pain or new or enlarging lumps on self exam. No neurological complaints.   OBJECTIVE:  BP 118/74   Ht 5' 3.5" (1.613 m)   Wt 137 lb (62.1 kg)   LMP 10/24/2019   BMI 23.89 kg/m  The patient appears well, alert, oriented x 3, in no distress. ENT normal.  Neck supple. No cervical or supraclavicular adenopathy or thyromegaly.  Lungs are clear, good air entry, no wheezes, rhonchi or rales. S1 and S2 normal, no murmurs, regular rate and rhythm.  Abdomen soft without tenderness, guarding, mass or organomegaly.  Neurological is normal, no focal findings.  BREAST EXAM: breasts appear normal, no suspicious masses, no skin or nipple changes or axillary nodes  PELVIC EXAM: VULVA: normal appearing vulva with no masses, tenderness or lesions, VAGINA: normal appearing vagina with normal color and discharge, no lesions, grade 2 cystocele, no rectocele, CERVIX: normal appearing cervix without discharge or lesions, UTERUS: uterus  is normal size, shape, consistency and nontender, no uterine prolapse appreciated, ADNEXA: normal adnexa in size, nontender and no masses  Chaperone: Caryn Bee present during the examination  ASSESSMENT:  44 y.o. G2P2 here for annual gynecologic exam  PLAN:   1. No hormonal or menstrual concerns.  2. Pap smear 10/2017.  No history of abnormal Pap smears.  Next Pap smear due 2024 following the current guidelines recommending the 5 year interval. 3. Cystocele is asymptomatic.  We will continue to monitor on her annual exams. 4. Contraception. Sprintec equivalent is working well for her.  No changes to her health history.  Thrombotic risks reviewed.  I will provide her with a 1 year refill at her request. 5. Mammogram 01/2019.  Breast exam normal.  We again discussed the family history of early onset breast cancer with her mother getting the diagnosis in her 44s, but no recurrences and no other family history.  We discussed the recommended genetic counseling in the situation, but she continues to not be interested at this time.  We discussed that positive gene testing results would result in recommendations for prophylactic BSO to reduce risk of ovarian cancer in that setting. 6. Health maintenance.  She would like to get routine screening blood work (lipids, CBC, CMP) when she is fasting so she will return another day.  The orders are placed as future status.  Return annually or sooner, prn.  Joseph Pierini MD 11/05/19

## 2020-04-29 ENCOUNTER — Other Ambulatory Visit: Payer: Self-pay | Admitting: Obstetrics and Gynecology

## 2020-04-29 DIAGNOSIS — Z0000 Encounter for general adult medical examination without abnormal findings: Secondary | ICD-10-CM

## 2020-06-15 ENCOUNTER — Ambulatory Visit: Payer: 59

## 2020-07-23 ENCOUNTER — Ambulatory Visit
Admission: RE | Admit: 2020-07-23 | Discharge: 2020-07-23 | Disposition: A | Discharge status: Home / Self Care | Payer: 59 | Source: Ambulatory Visit | Attending: Obstetrics and Gynecology | Admitting: Obstetrics and Gynecology

## 2020-07-23 ENCOUNTER — Other Ambulatory Visit: Payer: Self-pay

## 2020-07-23 DIAGNOSIS — Z0000 Encounter for general adult medical examination without abnormal findings: Secondary | ICD-10-CM

## 2020-11-03 NOTE — Progress Notes (Signed)
45 y.o. G2P2 Married White or Caucasian female here for annual exam.   Pt tried to give blood and was told hemoglobin was too low .about 4 months ago. Periods are not heavy. Currently taking Sprintec  Period Duration (Days): 4-5 Period Pattern: Regular Menstrual Flow:  (moderate to light) Menstrual Control: Maxi pad,Tampon Dysmenorrhea: None Patient's last menstrual period was 10/22/2020 (exact date).            Sexually active: Yes.    The current method of family planning is vasectomy.    Exercising: Yes.    walking, running & weights Smoker:  no  Health Maintenance: Pap:  10-13-17 neg HPV HR neg History of abnormal Pap:  no MMG:  07-28-20 category c density birads 1:neg Colonoscopy:  none BMD:   none Gardasil:   Not done Covid-19: pfizer Hep C testing: not done Screening Labs: will have today   reports that she has never smoked. She has never used smokeless tobacco. She reports current alcohol use. She reports that she does not use drugs.  Past Medical History:  Diagnosis Date  . Cystocele   . Uterine prolapse     Past Surgical History:  Procedure Laterality Date  . CESAREAN SECTION    . mole excision      Current Outpatient Medications  Medication Sig Dispense Refill  . levocetirizine (XYZAL) 5 MG tablet Take 5 mg by mouth every evening.    . Norethindrone Acetate-Ethinyl Estrad-FE (LOESTRIN 24 FE) 1-20 MG-MCG(24) tablet Take 1 tablet by mouth daily. 84 tablet 4   No current facility-administered medications for this visit.    Family History  Problem Relation Age of Onset  . Breast cancer Mother        AGE 31'S  . Cancer Maternal Grandfather        PROSTATE AND BILE DUCT CANCER  . Hyperlipidemia Paternal Uncle   . Cancer Father        Prostate    Review of Systems  Constitutional: Negative.   HENT: Negative.   Eyes: Negative.   Respiratory: Negative.   Cardiovascular: Negative.   Gastrointestinal: Negative.   Endocrine: Negative.   Genitourinary:  Negative.   Musculoskeletal: Negative.   Skin: Negative.   Allergic/Immunologic: Negative.   Neurological: Negative.   Hematological: Negative.   Psychiatric/Behavioral: Negative.     Exam:   BP 118/74   Pulse 76   Resp 16   Ht '5\' 3"'  (1.6 m)   Wt 137 lb (62.1 kg)   LMP 10/22/2020 (Exact Date)   BMI 24.27 kg/m   Height: '5\' 3"'  (160 cm)  General appearance: alert, cooperative and appears stated age, no acute distress Head: Normocephalic, without obvious abnormality Neck: no adenopathy, thyroid normal to inspection and palpation Lungs: clear to auscultation bilaterally Breasts: normal appearance, no masses or tenderness Heart: regular rate and rhythm Abdomen: soft, non-tender; no masses,  no organomegaly Extremities: extremities normal, no edema Skin: No rashes or lesions Lymph nodes: Cervical, supraclavicular, and axillary nodes normal. No abnormal inguinal nodes palpated Neurologic: Grossly normal   Pelvic: External genitalia:  no lesions              Urethra:  normal appearing urethra with no masses, tenderness or lesions              Bartholins and Skenes: normal                 Vagina: cystocele noted, normal appearing discharge, no lesions  Cervix: neg cervical motion tenderness, no visible lesions             Bimanual Exam:   Uterus:  normal size, contour, position, consistency, mobility, non-tender              Adnexa: no mass, fullness, tenderness                 Joy, CMA Chaperone was present for exam.  A:  Well woman exam  Screening for deficiency anemia - Plan: CBC  Screening for endocrine, metabolic and immunity disorder - Plan: Comp Met (CMET)  Lipid screening - Plan: Lipid Profile  Surveillance for birth control, oral contraceptives - Plan: Norethindrone Acetate-Ethinyl Estrad-FE (LOESTRIN 24 FE) 1-20 MG-MCG(24) tablet (will try to decrease estrogen and shorten placebo week in effort to decrease menstrual flow with concern for anemia). Pt  will call if desires to switch back to [place].  FmHx breast cancer (mother at young age). Previously offered genetic counseling, pt declined  P:   Pap : due 2024  Mammogram: due 07/2021  Since asymptomatic cystocele, will just continue to monitor

## 2020-11-05 ENCOUNTER — Ambulatory Visit (INDEPENDENT_AMBULATORY_CARE_PROVIDER_SITE_OTHER): Payer: 59 | Admitting: Nurse Practitioner

## 2020-11-05 ENCOUNTER — Other Ambulatory Visit: Payer: Self-pay

## 2020-11-05 ENCOUNTER — Encounter: Payer: Self-pay | Admitting: Nurse Practitioner

## 2020-11-05 ENCOUNTER — Encounter: Payer: 59 | Admitting: Obstetrics and Gynecology

## 2020-11-05 VITALS — BP 118/74 | HR 76 | Resp 16 | Ht 63.0 in | Wt 137.0 lb

## 2020-11-05 DIAGNOSIS — Z130 Encounter for screening for diseases of the blood and blood-forming organs and certain disorders involving the immune mechanism: Secondary | ICD-10-CM | POA: Diagnosis not present

## 2020-11-05 DIAGNOSIS — Z01419 Encounter for gynecological examination (general) (routine) without abnormal findings: Secondary | ICD-10-CM

## 2020-11-05 DIAGNOSIS — Z13228 Encounter for screening for other metabolic disorders: Secondary | ICD-10-CM

## 2020-11-05 DIAGNOSIS — Z13220 Encounter for screening for lipoid disorders: Secondary | ICD-10-CM

## 2020-11-05 DIAGNOSIS — Z1329 Encounter for screening for other suspected endocrine disorder: Secondary | ICD-10-CM

## 2020-11-05 DIAGNOSIS — Z3041 Encounter for surveillance of contraceptive pills: Secondary | ICD-10-CM

## 2020-11-05 MED ORDER — NORETHIN ACE-ETH ESTRAD-FE 1-20 MG-MCG(24) PO TABS: 1.0000 | ORAL_TABLET | Freq: Every day | ORAL | 4 refills | Status: DC

## 2020-11-05 NOTE — Patient Instructions (Signed)
Health Maintenance, Female Adopting a healthy lifestyle and getting preventive care are important in promoting health and wellness. Ask your health care provider about:  The right schedule for you to have regular tests and exams.  Things you can do on your own to prevent diseases and keep yourself healthy. What should I know about diet, weight, and exercise? Eat a healthy diet  Eat a diet that includes plenty of vegetables, fruits, low-fat dairy products, and lean protein.  Do not eat a lot of foods that are high in solid fats, added sugars, or sodium.   Maintain a healthy weight Body mass index (BMI) is used to identify weight problems. It estimates body fat based on height and weight. Your health care provider can help determine your BMI and help you achieve or maintain a healthy weight. Get regular exercise Get regular exercise. This is one of the most important things you can do for your health. Most adults should:  Exercise for at least 150 minutes each week. The exercise should increase your heart rate and make you sweat (moderate-intensity exercise).  Do strengthening exercises at least twice a week. This is in addition to the moderate-intensity exercise.  Spend less time sitting. Even light physical activity can be beneficial. Watch cholesterol and blood lipids Have your blood tested for lipids and cholesterol at 43 years of age, then have this test every 5 years. Have your cholesterol levels checked more often if:  Your lipid or cholesterol levels are high.  You are older than 43 years of age.  You are at high risk for heart disease. What should I know about cancer screening? Depending on your health history and family history, you may need to have cancer screening at various ages. This may include screening for:  Breast cancer.  Cervical cancer.  Colorectal cancer.  Skin cancer.  Lung cancer. What should I know about heart disease, diabetes, and high blood  pressure? Blood pressure and heart disease  High blood pressure causes heart disease and increases the risk of stroke. This is more likely to develop in people who have high blood pressure readings, are of African descent, or are overweight.  Have your blood pressure checked: ? Every 3-5 years if you are 18-39 years of age. ? Every year if you are 40 years old or older. Diabetes Have regular diabetes screenings. This checks your fasting blood sugar level. Have the screening done:  Once every three years after age 40 if you are at a normal weight and have a low risk for diabetes.  More often and at a younger age if you are overweight or have a high risk for diabetes. What should I know about preventing infection? Hepatitis B If you have a higher risk for hepatitis B, you should be screened for this virus. Talk with your health care provider to find out if you are at risk for hepatitis B infection. Hepatitis C Testing is recommended for:  Everyone born from 1945 through 1965.  Anyone with known risk factors for hepatitis C. Sexually transmitted infections (STIs)  Get screened for STIs, including gonorrhea and chlamydia, if: ? You are sexually active and are younger than 43 years of age. ? You are older than 43 years of age and your health care provider tells you that you are at risk for this type of infection. ? Your sexual activity has changed since you were last screened, and you are at increased risk for chlamydia or gonorrhea. Ask your health care provider   if you are at risk.  Ask your health care provider about whether you are at high risk for HIV. Your health care provider may recommend a prescription medicine to help prevent HIV infection. If you choose to take medicine to prevent HIV, you should first get tested for HIV. You should then be tested every 3 months for as long as you are taking the medicine. Pregnancy  If you are about to stop having your period (premenopausal) and  you may become pregnant, seek counseling before you get pregnant.  Take 400 to 800 micrograms (mcg) of folic acid every day if you become pregnant.  Ask for birth control (contraception) if you want to prevent pregnancy. Osteoporosis and menopause Osteoporosis is a disease in which the bones lose minerals and strength with aging. This can result in bone fractures. If you are 65 years old or older, or if you are at risk for osteoporosis and fractures, ask your health care provider if you should:  Be screened for bone loss.  Take a calcium or vitamin D supplement to lower your risk of fractures.  Be given hormone replacement therapy (HRT) to treat symptoms of menopause. Follow these instructions at home: Lifestyle  Do not use any products that contain nicotine or tobacco, such as cigarettes, e-cigarettes, and chewing tobacco. If you need help quitting, ask your health care provider.  Do not use street drugs.  Do not share needles.  Ask your health care provider for help if you need support or information about quitting drugs. Alcohol use  Do not drink alcohol if: ? Your health care provider tells you not to drink. ? You are pregnant, may be pregnant, or are planning to become pregnant.  If you drink alcohol: ? Limit how much you use to 0-1 drink a day. ? Limit intake if you are breastfeeding.  Be aware of how much alcohol is in your drink. In the [place] one drink equals one 12 oz bottle of beer (355 mL), one 5 oz glass of wine (148 mL), or one 1 oz glass of hard liquor (44 mL). General instructions  Schedule regular health, dental, and eye exams.  Stay current with your vaccines.  Tell your health care provider if: ? You often feel depressed. ? You have ever been abused or do not feel safe at home. Summary  Adopting a healthy lifestyle and getting preventive care are important in promoting health and wellness.  Follow your health care provider's instructions about healthy  diet, exercising, and getting tested or screened for diseases.  Follow your health care provider's instructions on monitoring your cholesterol and blood pressure. This information is not intended to replace advice given to you by your health care provider. Make sure you discuss any questions you have with your health care provider. Document Revised: 05/16/2018 Document Reviewed: 05/16/2018 Elsevier Patient Education  2021 Elsevier Inc.  

## 2020-11-06 ENCOUNTER — Telehealth: Payer: Self-pay | Admitting: *Deleted

## 2020-11-06 DIAGNOSIS — R7989 Other specified abnormal findings of blood chemistry: Secondary | ICD-10-CM

## 2020-11-06 DIAGNOSIS — Z1211 Encounter for screening for malignant neoplasm of colon: Secondary | ICD-10-CM

## 2020-11-06 LAB — COMPREHENSIVE METABOLIC PANEL
AG Ratio: 1.5 (calc) (ref 1.0–2.5)
ALT: 9 U/L (ref 6–29)
AST: 14 U/L (ref 10–30)
Albumin: 4 g/dL (ref 3.6–5.1)
Alkaline phosphatase (APISO): 44 U/L (ref 31–125)
BUN: 13 mg/dL (ref 7–25)
CO2: 22 mmol/L (ref 20–32)
Calcium: 9 mg/dL (ref 8.6–10.2)
Chloride: 106 mmol/L (ref 98–110)
Creat: 0.7 mg/dL (ref 0.50–1.10)
Globulin: 2.6 g/dL (calc) (ref 1.9–3.7)
Glucose, Bld: 86 mg/dL (ref 65–99)
Potassium: 4.8 mmol/L (ref 3.5–5.3)
Sodium: 137 mmol/L (ref 135–146)
Total Bilirubin: 0.3 mg/dL (ref 0.2–1.2)
Total Protein: 6.6 g/dL (ref 6.1–8.1)

## 2020-11-06 LAB — CBC
HCT: 35.8 % (ref 35.0–45.0)
Hemoglobin: 11 g/dL — ABNORMAL LOW (ref 11.7–15.5)
MCH: 23.8 pg — ABNORMAL LOW (ref 27.0–33.0)
MCHC: 30.7 g/dL — ABNORMAL LOW (ref 32.0–36.0)
MCV: 77.3 fL — ABNORMAL LOW (ref 80.0–100.0)
MPV: 12.6 fL — ABNORMAL HIGH (ref 7.5–12.5)
Platelets: 240 10*3/uL (ref 140–400)
RBC: 4.63 10*6/uL (ref 3.80–5.10)
RDW: 17.2 % — ABNORMAL HIGH (ref 11.0–15.0)
WBC: 6.8 10*3/uL (ref 3.8–10.8)

## 2020-11-06 LAB — LIPID PANEL
Cholesterol: 233 mg/dL — ABNORMAL HIGH (ref <200)
HDL: 78 mg/dL (ref >=50)
LDL Cholesterol (Calc): 132 mg/dL (calc) — ABNORMAL HIGH
Non-HDL Cholesterol (Calc): 155 mg/dL (calc) — ABNORMAL HIGH (ref <130)
Total CHOL/HDL Ratio: 3 (calc) (ref <5.0)
Triglycerides: 120 mg/dL (ref <150)

## 2020-11-06 NOTE — Telephone Encounter (Signed)
Per Karma Ganja note see below:  Your cbc will need to be rechecked in 3 months  Order was placed

## 2020-11-06 NOTE — Telephone Encounter (Addendum)
Referral placed per note below:    ----- Message from Karma Ganja, NP sent at 11/06/2020  8:35 AM EDT -----  Good morning. Your lab work does confirm that your hemoglobin is low. Sometimes, this can be the result of "bleeding" (there are other possibilities too, but we need to investigate this first). Since your menstrual cycle does not seem that heavy, it is hard to say if that is a contributor or not. I hope that the change in your pill (shortening the placebo week and lowering the estrogen) decreases your blood flow even more, however, I think it is important to look into other possible reasons why your hemoglobin is low.  I recommend a referral to gastroenterology for a colonoscopy to see if there is any possibility you are bleeding in the gastro intestinal tract.   In the meanwhile, I encourage you to take an iron supplement, take a good multivitamin, and/or boost your iron rich food consumption (lean meat, beans, spinach, etc).  Your cholesterol was a little elevated, at this time, just focus on exercise and eating healthy. Gardiner Sleeper should be checked next year.  Your chemistry panel was normal: kidney function, liver function, electrolytes, and blood sugar.  The nurse will call with details of your referral. Your cbc will need to be rechecked in 3 months.

## 2020-11-13 ENCOUNTER — Ambulatory Visit: Payer: 59 | Admitting: Physician Assistant

## 2020-11-18 ENCOUNTER — Ambulatory Visit: Payer: Self-pay | Admitting: Physician Assistant

## 2020-12-14 ENCOUNTER — Encounter: Payer: Self-pay | Admitting: Physician Assistant

## 2020-12-14 ENCOUNTER — Ambulatory Visit (INDEPENDENT_AMBULATORY_CARE_PROVIDER_SITE_OTHER): Payer: 59 | Admitting: Physician Assistant

## 2020-12-14 ENCOUNTER — Other Ambulatory Visit (INDEPENDENT_AMBULATORY_CARE_PROVIDER_SITE_OTHER): Payer: 59

## 2020-12-14 VITALS — BP 112/82 | HR 80 | Ht 63.0 in | Wt 138.6 lb

## 2020-12-14 DIAGNOSIS — D649 Anemia, unspecified: Secondary | ICD-10-CM

## 2020-12-14 LAB — IBC + FERRITIN
Ferritin: 8.6 ng/mL — ABNORMAL LOW (ref 10.0–291.0)
Iron: 159 ug/dL — ABNORMAL HIGH (ref 42–145)
Saturation Ratios: 34.1 % (ref 20.0–50.0)
Transferrin: 333 mg/dL (ref 212.0–360.0)

## 2020-12-14 LAB — HEMOGLOBIN: Hemoglobin: 13.4 g/dL (ref 12.0–15.0)

## 2020-12-14 MED ORDER — NA SULFATE-K SULFATE-MG SULF 17.5-3.13-1.6 GM/177ML PO SOLN: 1.0000 | Freq: Once | ORAL | 0 refills | Status: AC

## 2020-12-14 NOTE — Patient Instructions (Signed)
Your provider has requested that you go to the basement level for lab work before leaving today. Press "B" on the elevator. The lab is located at the first door on the left as you exit the elevator.   If you are age 43 or older, your body mass index should be between 23-30. Your Body mass index is 24.55 kg/m. If this is out of the aforementioned range listed, please consider follow up with your Primary Care Provider.  If you are age 79 or younger, your body mass index should be between 19-25. Your Body mass index is 24.55 kg/m. If this is out of the aformentioned range listed, please consider follow up with your Primary Care Provider.   __________________________________________________________  The [facility] providers would like to encourage you to use [facility] to communicate with providers for non-urgent requests or questions.  Due to long hold times on the telephone, sending your provider a message by [facility] may be a faster and more efficient way to get a response.  Please allow 48 business hours for a response.  Please remember that this is for non-urgent requests.   You have been scheduled for an endoscopy. Please follow written instructions given to you at your visit today. If you use inhalers (even only as needed), please bring them with you on the day of your procedure.  Follow up in clinic per recommendations from Dr. Bryan Lemma.

## 2020-12-14 NOTE — Progress Notes (Signed)
Chief Complaint: Low hemoglobin  HPI:    Chloe Valdez is a 45 year old Caucasian female with a past medical history as listed below, who was referred to me by Kathyrn Lass, MD for a complaint of low hemoglobin.      11/05/2020 patient had a CBC by her gynecologist with a hemoglobin of 11.  (2 years ago 12.5).  MCV low at 77.3.  CMP normal.    Today, the patient tells me that she has a low hemoglobin "off-and-on" over the past few years because she tries to give blood every couple of months but she has been rejected occasionally.  Last time she was rejected was January 30, 2020 when her hemoglobin was only 9.8 per her records.  Then in January on the 20th it was back up to 12.8, then April 7 was last time she gave blood when it was 11.7.  Tells me he has done this for a few years now.  Apparently her gynecologist started her on iron supplement about a month ago when she was found to have low hemoglobin.  Tells me that she really has no other issues other than just some chronic fatigue which has not changed recently.    Denies fever, chills, weight loss, abdominal pain, change in bowel habits, heartburn or reflux.  Past Medical History:  Diagnosis Date   Cystocele    Uterine prolapse     Past Surgical History:  Procedure Laterality Date   CESAREAN SECTION     mole excision      Current Outpatient Medications  Medication Sig Dispense Refill   levocetirizine (XYZAL) 5 MG tablet Take 5 mg by mouth every evening.     Norethindrone Acetate-Ethinyl Estrad-FE (LOESTRIN 24 FE) 1-20 MG-MCG(24) tablet Take 1 tablet by mouth daily. 84 tablet 4   No current facility-administered medications for this visit.    Allergies as of 12/14/2020 - Review Complete 12/14/2020  Allergen Reaction Noted   Sulfa antibiotics Rash 06/13/2011    Family History  Problem Relation Age of Onset   Breast cancer Mother        AGE 24'S   Cancer Father        Prostate   Cancer Maternal Grandfather        PROSTATE AND  BILE DUCT CANCER   Hyperlipidemia Paternal Uncle    Colon cancer Neg Hx    Esophageal cancer Neg Hx    Pancreatic cancer Neg Hx    Stomach cancer Neg Hx    Liver disease Neg Hx     Social History   Socioeconomic History   Marital status: Married    Spouse name: Not on file   Number of children: Not on file   Years of education: Not on file   Highest education level: Not on file  Occupational History   Not on file  Tobacco Use   Smoking status: Never   Smokeless tobacco: Never  Vaping Use   Vaping Use: Never used  Substance and Sexual Activity   Alcohol use: Yes    Comment: socially on weekends   Drug use: Never   Sexual activity: Yes    Partners: Male    Birth control/protection: Pill  Other Topics Concern   Not on file  Social History Narrative   Not on file   Social Determinants of Health   Financial Resource Strain: Not on file  Food Insecurity: Not on file  Transportation Needs: Not on file  Physical Activity: Not on file  Stress:  Not on file  Social Connections: Not on file  Intimate Partner Violence: Not on file    Review of Systems:    Constitutional: No weight loss, fever or chills Skin: No rash  Cardiovascular: No chest pain  Respiratory: No SOB  Gastrointestinal: See HPI and otherwise negative Genitourinary: No dysuria  Neurological: No headache, dizziness or syncope Musculoskeletal: No new muscle or joint pain Hematologic: No bleeding  Psychiatric: No history of depression or anxiety   Physical Exam:  Vital signs: BP 112/82   Pulse 80   Ht 5\' 3"  (1.6 m)   Wt 138 lb 9.6 oz (62.9 kg)   SpO2 99%   BMI 24.55 kg/m   Constitutional:   Pleasant Caucasian female appears to be in NAD, Well developed, Well nourished, alert and cooperative Head:  Normocephalic and atraumatic. Eyes:   PEERL, EOMI. No icterus. Conjunctiva pink. Ears:  Normal auditory acuity. Neck:  Supple Throat: Oral cavity and pharynx without inflammation, swelling or lesion.   Respiratory: Respirations even and unlabored. Lungs clear to auscultation bilaterally.   No wheezes, crackles, or rhonchi.  Cardiovascular: Normal S1, S2. No MRG. Regular rate and rhythm. No peripheral edema, cyanosis or pallor.  Gastrointestinal:  Soft, nondistended, nontender. No rebound or guarding. Normal bowel sounds. No appreciable masses or hepatomegaly. Rectal:  Not performed.  Msk:  Symmetrical without gross deformities. Without edema, no deformity or joint abnormality.  Neurologic:  Alert and  oriented x4;  grossly normal neurologically.  Skin:   Dry and intact without significant lesions or rashes. Psychiatric: Demonstrates good judgement and reason without abnormal affect or behaviors.  RELEVANT LABS AND IMAGING: CBC    Component Value Date/Time   WBC 6.8 11/05/2020 1018   RBC 4.63 11/05/2020 1018   HGB 11.0 (L) 11/05/2020 1018   HCT 35.8 11/05/2020 1018   PLT 240 11/05/2020 1018   MCV 77.3 (L) 11/05/2020 1018   MCH 23.8 (L) 11/05/2020 1018   MCHC 30.7 (L) 11/05/2020 1018   RDW 17.2 (H) 11/05/2020 1018   LYMPHSABS 2,049 10/17/2018 0841   MONOABS 480 10/13/2016 0836   EOSABS 113 10/17/2018 0841   BASOSABS 41 10/17/2018 0841    CMP     Component Value Date/Time   NA 137 11/05/2020 1018   K 4.8 11/05/2020 1018   CL 106 11/05/2020 1018   CO2 22 11/05/2020 1018   GLUCOSE 86 11/05/2020 1018   BUN 13 11/05/2020 1018   CREATININE 0.70 11/05/2020 1018   CALCIUM 9.0 11/05/2020 1018   PROT 6.6 11/05/2020 1018   ALBUMIN 3.7 10/13/2016 0836   AST 14 11/05/2020 1018   ALT 9 11/05/2020 1018   ALKPHOS 32 (L) 10/13/2016 0836   BILITOT 0.3 11/05/2020 1018    Assessment: 1.  Low hemoglobin: Apparently fluctuates off-and-on over the past year, as low as 9.8 and a blood check in August 2021, most recently started on iron by her gynecologist; consider GI source of blood loss versus blood donation versus other  Plan: 1.  Scheduled patient for a diagnostic EGD and colonoscopy  in the [place] with Dr. Bryan Lemma.  Did provide the patient with a detailed list of risks for procedures and she agrees to proceed.  Patient will continue to follow with Dr. Bryan Lemma after time of procedures as her primary GI physician. 2.  We will recheck hemoglobin and iron studies as well as B12 and folate today. 3.  Patient to follow in clinic per recommendations after time of procedures.  Ellouise Newer, PA-C  [facility] 12/14/2020, 9:34 AM  Cc: Kathyrn Lass, MD

## 2020-12-14 NOTE — Progress Notes (Signed)
Agree with the assessment and plan as outlined by Jennifer Lemmon, PA-C. ? ?Kim Lauver, DO, FACG ? ?

## 2020-12-24 ENCOUNTER — Telehealth: Payer: Self-pay | Admitting: Physician Assistant

## 2020-12-24 NOTE — Telephone Encounter (Signed)
Pt calling to inform Suprep is too expensive. Pt is requesting alternative med.. Plz advise  thank you

## 2020-12-29 MED ORDER — CLENPIQ 10-3.5-12 MG-GM -GM/160ML PO SOLN: 1.0000 | Freq: Once | ORAL | 0 refills | Status: AC

## 2020-12-29 NOTE — Telephone Encounter (Signed)
Spoke with patient informed her will call pharmacy to see what prep is covered by her insurance and will send new prep instructions via mychart. Patient agreed with plan

## 2020-12-29 NOTE — Telephone Encounter (Signed)
Per pharmacy Clenpiq is covered by patient's insurance. Script for [employer] to pharmacy and new instructions sent via mychart.

## 2021-01-06 ENCOUNTER — Other Ambulatory Visit: Payer: 59

## 2021-01-06 ENCOUNTER — Other Ambulatory Visit: Payer: Self-pay

## 2021-01-06 DIAGNOSIS — R7989 Other specified abnormal findings of blood chemistry: Secondary | ICD-10-CM

## 2021-01-06 LAB — CBC
HCT: 42.2 % (ref 35.0–45.0)
Hemoglobin: 13.7 g/dL (ref 11.7–15.5)
MCH: 28 pg (ref 27.0–33.0)
MCHC: 32.5 g/dL (ref 32.0–36.0)
MCV: 86.3 fL (ref 80.0–100.0)
MPV: 12.5 fL (ref 7.5–12.5)
Platelets: 209 10*3/uL (ref 140–400)
RBC: 4.89 10*6/uL (ref 3.80–5.10)
RDW: 16.8 % — ABNORMAL HIGH (ref 11.0–15.0)
WBC: 7.5 10*3/uL (ref 3.8–10.8)

## 2021-01-07 ENCOUNTER — Telehealth: Payer: Self-pay

## 2021-01-07 NOTE — Telephone Encounter (Signed)
I would have her space out the iron to 3 x a week and have her return in 3 months for a CBC and ferritin. We still don't have an explanation for her anemia.

## 2021-01-07 NOTE — Telephone Encounter (Signed)
Dr. Gentry Fitz result note: "Please let the patient know that her she is no longer anemic. Please check with her if she is taking a daily iron tablet or what she has changed. I see that she has a colonoscopy and endoscopy scheduled which is good."  Patient called back to inform that when she saw Karma Ganja, NP 11/05/20 she told her to start taking daily iron and patient has been taking 65 mg of iron daily.  Patient asked in light of normal hemoglobin if she needs to continue taking?

## 2021-01-11 ENCOUNTER — Other Ambulatory Visit: Payer: Self-pay

## 2021-01-11 DIAGNOSIS — D508 Other iron deficiency anemias: Secondary | ICD-10-CM

## 2021-01-11 NOTE — Telephone Encounter (Signed)
Spoke with patient and informed her.  I offered to schedule lab appointment but she is starting new job 3 days week and doesn't know yet what 3 days so she will call to schedule lab appt. Orders placed.

## 2021-02-01 ENCOUNTER — Encounter: Payer: Self-pay | Admitting: Gastroenterology

## 2021-02-01 ENCOUNTER — Other Ambulatory Visit: Payer: Self-pay

## 2021-02-01 ENCOUNTER — Ambulatory Visit (AMBULATORY_SURGERY_CENTER): Payer: 59 | Admitting: Gastroenterology

## 2021-02-01 ENCOUNTER — Other Ambulatory Visit: Payer: Self-pay | Admitting: Gastroenterology

## 2021-02-01 VITALS — BP 98/58 | HR 77 | Temp 98.6°F | Resp 13 | Ht 63.0 in | Wt 138.0 lb

## 2021-02-01 DIAGNOSIS — D509 Iron deficiency anemia, unspecified: Secondary | ICD-10-CM | POA: Diagnosis not present

## 2021-02-01 DIAGNOSIS — K648 Other hemorrhoids: Secondary | ICD-10-CM

## 2021-02-01 DIAGNOSIS — D125 Benign neoplasm of sigmoid colon: Secondary | ICD-10-CM

## 2021-02-01 DIAGNOSIS — K5730 Diverticulosis of large intestine without perforation or abscess without bleeding: Secondary | ICD-10-CM | POA: Diagnosis not present

## 2021-02-01 DIAGNOSIS — K640 First degree hemorrhoids: Secondary | ICD-10-CM

## 2021-02-01 MED ORDER — SODIUM CHLORIDE 0.9 % IV SOLN: 500.0000 mL | Freq: Once | INTRAVENOUS | Status: DC

## 2021-02-01 NOTE — Progress Notes (Signed)
Sedate, gd SR's, VSS, report to RN 

## 2021-02-01 NOTE — Patient Instructions (Signed)
Handout given:  polyps Resume previous diet Continue current medications Await pathology results  YOU HAD AN ENDOSCOPIC PROCEDURE TODAY AT [facility]:   Refer to the procedure report that was given to you for any specific questions about what was found during the examination.  If the procedure report does not answer your questions, please call your gastroenterologist to clarify.  If you requested that your care partner not be given the details of your procedure findings, then the procedure report has been included in a sealed envelope for you to review at your convenience later.  YOU SHOULD EXPECT: Some feelings of bloating in the abdomen. Passage of more gas than usual.  Walking can help get rid of the air that was put into your GI tract during the procedure and reduce the bloating. If you had a lower endoscopy (such as a colonoscopy or flexible sigmoidoscopy) you may notice spotting of blood in your stool or on the toilet paper. If you underwent a bowel prep for your procedure, you may not have a normal bowel movement for a few days.  Please Note:  You might notice some irritation and congestion in your nose or some drainage.  This is from the oxygen used during your procedure.  There is no need for concern and it should clear up in a day or so.  SYMPTOMS TO REPORT IMMEDIATELY:  Following lower endoscopy (colonoscopy or flexible sigmoidoscopy):  Excessive amounts of blood in the stool  Significant tenderness or worsening of abdominal pains  Swelling of the abdomen that is new, acute  Fever of 100F or higher  Following upper endoscopy (EGD)  Vomiting of blood or coffee ground material  New chest pain or pain under the shoulder blades  Painful or persistently difficult swallowing  New shortness of breath  Fever of 100F or higher  Black, tarry-looking stools  For urgent or emergent issues, a gastroenterologist can be reached at any hour by calling [phone]. Do not  use MyChart messaging for urgent concerns.   DIET:  We do recommend a small meal at first, but then you may proceed to your regular diet.  Drink plenty of fluids but you should avoid alcoholic beverages for 24 hours.  ACTIVITY:  You should plan to take it easy for the rest of today and you should NOT DRIVE or use heavy machinery until tomorrow (because of the sedation medicines used during the test).    FOLLOW UP: Our staff will call the number listed on your records 48-72 hours following your procedure to check on you and address any questions or concerns that you may have regarding the information given to you following your procedure. If we do not reach you, we will leave a message.  We will attempt to reach you two times.  During this call, we will ask if you have developed any symptoms of COVID 19. If you develop any symptoms (ie: fever, flu-like symptoms, shortness of breath, cough etc.) before then, please call [phone].  If you test positive for Covid 19 in the 2 weeks post procedure, please call and report this information to [place].    If any biopsies were taken you will be contacted by phone or by letter within the next 1-3 weeks.  Please call us at [phone] if you have not heard about the biopsies in 3 weeks.   SIGNATURES/CONFIDENTIALITY: You and/or your care partner have signed paperwork which will be entered into your electronic medical record.  These signatures  attest to the fact that that the information above on your After Visit Summary has been reviewed and is understood.  Full responsibility of the confidentiality of this discharge information lies with you and/or your care-partner.

## 2021-02-01 NOTE — Progress Notes (Signed)
Pt's states no medical or surgical changes since previsit or office visit. 

## 2021-02-01 NOTE — Progress Notes (Signed)
GASTROENTEROLOGY PROCEDURE H&P NOTE   Primary Care Physician: Kathyrn Lass, MD    Reason for Procedure:  Microcytic anemia  Plan:    EGD/colonoscopy  Patient is appropriate for endoscopic procedure(s) in the ambulatory ([facility]) setting.  The nature of the procedure, as well as the risks, benefits, and alternatives were carefully and thoroughly reviewed with the patient. Ample time for discussion and questions allowed. The patient understood, was satisfied, and agreed to proceed.     HPI: Chloe Valdez is a 45 y.o. female who presents for EGD and colonoscopy for evaluation of microcytic anemia.  Anemia responsive to oral iron therapy.  Otherwise without any GI symptoms and no known family history of colon cancer, GI malignancy, Celiac Disease, IBD, etc.  Was last seen by Ellouise Newer in the GI clinic for this issue on 12/14/2020.  No interval change in medical history since then.  Past Medical History:  Diagnosis Date   Cystocele    Uterine prolapse     Past Surgical History:  Procedure Laterality Date   CESAREAN SECTION     mole excision      Prior to Admission medications   Medication Sig Start Date End Date Taking? Authorizing Provider  levocetirizine (XYZAL) 5 MG tablet Take 5 mg by mouth every evening.    [provider]  Norethindrone Acetate-Ethinyl Estrad-FE (LOESTRIN 24 FE) 1-20 MG-MCG(24) tablet Take 1 tablet by mouth daily. 11/05/20   Karma Ganja, NP    Current Outpatient Medications  Medication Sig Dispense Refill   levocetirizine (XYZAL) 5 MG tablet Take 5 mg by mouth every evening.     Norethindrone Acetate-Ethinyl Estrad-FE (LOESTRIN 24 FE) 1-20 MG-MCG(24) tablet Take 1 tablet by mouth daily. 84 tablet 4   No current facility-administered medications for this visit.    Allergies as of 02/01/2021 - Review Complete 12/14/2020  Allergen Reaction Noted   Sulfa antibiotics Rash 06/13/2011    Family History  Problem Relation Age of Onset    Breast cancer Mother        AGE 17'S   Cancer Father        Prostate   Cancer Maternal Grandfather        PROSTATE AND BILE DUCT CANCER   Hyperlipidemia Paternal Uncle    Colon cancer Neg Hx    Esophageal cancer Neg Hx    Pancreatic cancer Neg Hx    Stomach cancer Neg Hx    Liver disease Neg Hx     Social History   Socioeconomic History   Marital status: Married    Spouse name: Not on file   Number of children: Not on file   Years of education: Not on file   Highest education level: Not on file  Occupational History   Not on file  Tobacco Use   Smoking status: Never   Smokeless tobacco: Never  Vaping Use   Vaping Use: Never used  Substance and Sexual Activity   Alcohol use: Yes    Comment: socially on weekends   Drug use: Never   Sexual activity: Yes    Partners: Male    Birth control/protection: Pill  Other Topics Concern   Not on file  Social History Narrative   Not on file   Social Determinants of Health   Financial Resource Strain: Not on file  Food Insecurity: Not on file  Transportation Needs: Not on file  Physical Activity: Not on file  Stress: Not on file  Social Connections: Not on file  Intimate Partner Violence: Not on file    Physical Exam: Vital signs in last 24 hours: '@There'$  were no vitals taken for this visit. GEN: NAD EYE: Sclerae anicteric ENT: MMM CV: Non-tachycardic Pulm: CTA b/l GI: Soft, NT/ND NEURO:  Alert & Oriented x 3   Gerrit Heck, DO [facility]   02/01/2021 10:06 AM

## 2021-02-01 NOTE — Op Note (Signed)
[name]: Chloe Valdez Procedure Date: 02/01/2021 11:04 AM MRN: [id number] Endoscopist: Gerrit Heck , MD Age: 45 Referring MD:  Date of Birth: [birth year] Gender: Female Account #: [id number] Procedure:                Upper GI endoscopy Indications:              Microcytic anemia with reduced ferritin. No overt                            GI bleeding. Medicines:                Monitored Anesthesia Care Procedure:                Pre-Anesthesia Assessment:                           - Prior to the procedure, a History and Physical                            was performed, and patient medications and                            allergies were reviewed. The patient's tolerance of                            previous anesthesia was also reviewed. The risks                            and benefits of the procedure and the sedation                            options and risks were discussed with the patient.                            All questions were answered, and informed consent                            was obtained. Prior Anticoagulants: The patient has                            taken no previous anticoagulant or antiplatelet                            agents. ASA Grade Assessment: II - A patient with                            mild systemic disease. After reviewing the risks                            and benefits, the patient was deemed in                            satisfactory condition to undergo the procedure.  After obtaining informed consent, the endoscope was                            passed under direct vision. Throughout the                            procedure, the patient's blood pressure, pulse, and                            oxygen saturations were monitored continuously. The                            GIF [id number] was introduced through the                            mouth, and advanced to the third part of  duodenum.                            The upper GI endoscopy was accomplished without                            difficulty. The patient tolerated the procedure                            well. Scope In: Scope Out: Findings:                 The examined esophagus was normal.                           The entire examined stomach was normal. Biopsies                            were taken with a cold forceps for histology and                            Helicobacter pylori testing. Estimated blood loss                            was minimal.                           The examined duodenum was normal. Biopsies for                            histology were taken with a cold forceps for                            evaluation of celiac disease. Estimated blood loss                            was minimal. Complications:            No immediate complications. Estimated Blood Loss:     Estimated blood loss was minimal. Impression:               -  Normal esophagus.                           - Normal stomach. Biopsied.                           - Normal examined duodenum. Biopsied. Recommendation:           - Patient has a contact number available for                            emergencies. The signs and symptoms of potential                            delayed complications were discussed with the                            patient. Return to normal activities tomorrow.                            Written discharge instructions were provided to the                            patient.                           - Resume previous diet.                           - Continue present medications.                           - Await pathology results. Gerrit Heck, MD 02/01/2021 11:33:42 AM

## 2021-02-01 NOTE — Progress Notes (Signed)
Called to room to assist during endoscopic procedure.  Patient ID and intended procedure confirmed with present staff. Received instructions for my participation in the procedure from the performing physician.  

## 2021-02-01 NOTE — Op Note (Signed)
[facility] Patient Name: Chloe Valdez Procedure Date: 02/01/2021 11:03 AM MRN: [id number] Endoscopist: Gerrit Heck , MD Age: 45 Referring MD:  Date of Birth: 11-16-1975 Gender: Female Account #: [id number] Procedure:                Colonoscopy Indications:              Microcytic anemia with reduced ferritin. No overt                            GI bleeding. Medicines:                Monitored Anesthesia Care Procedure:                Pre-Anesthesia Assessment:                           - Prior to the procedure, a History and Physical                            was performed, and patient medications and                            allergies were reviewed. The patient's tolerance of                            previous anesthesia was also reviewed. The risks                            and benefits of the procedure and the sedation                            options and risks were discussed with the patient.                            All questions were answered, and informed consent                            was obtained. Prior Anticoagulants: The patient has                            taken no previous anticoagulant or antiplatelet                            agents. ASA Grade Assessment: II - A patient with                            mild systemic disease. After reviewing the risks                            and benefits, the patient was deemed in                            satisfactory condition to undergo the procedure.  After obtaining informed consent, the colonoscope                            was passed under direct vision. Throughout the                            procedure, the patient's blood pressure, pulse, and                            oxygen saturations were monitored continuously. The                            Olympus CF-HQ190L (Serial# [id number]) Colonoscope was                            introduced through the anus and advanced  to the the                            terminal ileum. The colonoscopy was performed                            without difficulty. The patient tolerated the                            procedure well. The quality of the bowel                            preparation was good. The terminal ileum, ileocecal                            valve, appendiceal orifice, and rectum were                            photographed. Scope In: 11:15:39 AM Scope Out: 11:29:06 AM Scope Withdrawal Time: 0 hours 10 minutes 24 seconds  Total Procedure Duration: 0 hours 13 minutes 27 seconds  Findings:                 The perianal and digital rectal examinations were                            normal.                           A 5 mm polyp was found in the sigmoid colon. The                            polyp was sessile. The polyp was removed with a                            cold snare. Resection and retrieval were complete.                            Estimated blood loss was minimal.  Non-bleeding internal hemorrhoids were found during                            retroflexion. The hemorrhoids were small.                           The exam was otherwise normal throughout the                            remainder of the colon. No areas of mucosal                            erythema, edema, erosions, or ulceration noted on                            this study.                           The terminal ileum appeared normal. Complications:            No immediate complications. Estimated Blood Loss:     Estimated blood loss was minimal. Impression:               - One 5 mm polyp in the sigmoid colon, removed with                            a cold snare. Resected and retrieved.                           - Non-bleeding internal hemorrhoids.                           - The examined portion of the ileum was normal. Recommendation:           - Patient has a contact number available for                             emergencies. The signs and symptoms of potential                            delayed complications were discussed with the                            patient. Return to normal activities tomorrow.                            Written discharge instructions were provided to the                            patient.                           - Resume previous diet.                           - Continue present medications.                           -  Await pathology results.                           - Repeat colonoscopy for surveillance based on                            pathology results. Gerrit Heck, MD 02/01/2021 11:36:57 AM

## 2021-02-01 NOTE — Progress Notes (Signed)
C.W. vital signs. 

## 2021-02-03 ENCOUNTER — Telehealth: Payer: Self-pay | Admitting: *Deleted

## 2021-02-03 NOTE — Telephone Encounter (Signed)
  Follow up Call-  Call back number 02/01/2021  Post procedure Call Back phone  # [phone]  Permission to leave phone message Yes  Some recent data might be hidden     Patient questions:  Do you have a fever, pain , or abdominal swelling? No. Pain Score  0 *  Have you tolerated food without any problems? Yes.    Have you been able to return to your normal activities? Yes.    Do you have any questions about your discharge instructions: Diet   No. Medications  No. Follow up visit  No.  Do you have questions or concerns about your Care? No.  Actions: * If pain score is 4 or above: No action needed, pain <4.  Have you developed a fever since your procedure? no  2.   Have you had an respiratory symptoms (SOB or cough) since your procedure? no  3.   Have you tested positive for COVID 19 since your procedure no  4.   Have you had any family members/close contacts diagnosed with the COVID 19 since your procedure?  no   If yes to any of these questions please route to Joylene John, RN and Joella Prince, RN

## 2021-02-12 ENCOUNTER — Encounter: Payer: Self-pay | Admitting: Gastroenterology

## 2021-03-26 ENCOUNTER — Telehealth: Payer: Self-pay | Admitting: *Deleted

## 2021-03-26 NOTE — Telephone Encounter (Signed)
Patient called stating her cycle started yesterday and was slightly heavier than normally with small clots. Reports back in June she was switched to Loestrin Fe 24 due to low hemoglobin. Patient said her periods are now shorter, next cycle not due until 2 week. Reports she did miss a pill on Saturday,however she did double up on Sunday. I explained abnormal bleeding maybe due to missed pill even though she did double up. I recommended she continue the pill daily and call back if still bleeding in 7 days. I did explained bleeding precautions if bleeding because heavy ex: changing pad less than 1 hour or every 30 minutes. Patient said the bleeding is not that severe and will follow up cycle greater than 7 days.

## 2021-04-05 NOTE — Progress Notes (Signed)
GYNECOLOGY  VISIT   HPI: 45 y.o.   Married  Caucasian  female   G2P2 with Patient's last menstrual period was 03/25/2021.   here for   irregular bleeding. Patient missed pill in current pill pack. States cycle was supposed to start tomorrow.  Heavy bleeding started 03/25/21 and lasted for 2 days.  Stopped for 12 hours and then restarted and has been lighter but persistent.  Some bloating.  Did have cramping with her heavier bleeding.   She did miss a pill around 03/20/21.  She took the pill the following day.  No other late pills.   She previously was on Sprintec 28 and was switched to LoEstrin.   Patient was diagnosed with iron deficiency.  She had an endoscopy and colonoscopy and had one polyp removed.   GYNECOLOGIC HISTORY: Patient's last menstrual period was 03/25/2021. Contraception:  Vasectomy/Loestrin Menopausal hormone therapy:  none Last mammogram: 07-23-20 Neg/BiRads1 Last pap smear:  10-13-17 Neg:Neg HR HPV, 07-18-13 Neg:Neg HR HPV        OB History     Gravida  2   Para  2   Term      Preterm      AB      Living  2      SAB      IAB      Ectopic      Multiple      Live Births                 There are no problems to display for this patient.   Past Medical History:  Diagnosis Date   Cystocele    Uterine prolapse     Past Surgical History:  Procedure Laterality Date   CESAREAN SECTION     mole excision      Current Outpatient Medications  Medication Sig Dispense Refill   Ferrous Sulfate (IRON PO) Take by mouth 3 (three) times a week.     levocetirizine (XYZAL) 5 MG tablet Take 5 mg by mouth every evening.     Norethindrone Acetate-Ethinyl Estrad-FE (LOESTRIN 24 FE) 1-20 MG-MCG(24) tablet Take 1 tablet by mouth daily. 84 tablet 4   No current facility-administered medications for this visit.     ALLERGIES: Sulfa antibiotics  Family History  Problem Relation Age of Onset   Breast cancer Mother        AGE 26'S   Cancer  Father        Prostate   Cancer Maternal Grandfather        PROSTATE AND BILE DUCT CANCER   Hyperlipidemia Paternal Uncle    Colon cancer Neg Hx    Esophageal cancer Neg Hx    Pancreatic cancer Neg Hx    Stomach cancer Neg Hx    Liver disease Neg Hx     Social History   Socioeconomic History   Marital status: Married    Spouse name: Not on file   Number of children: Not on file   Years of education: Not on file   Highest education level: Not on file  Occupational History   Not on file  Tobacco Use   Smoking status: Never   Smokeless tobacco: Never  Vaping Use   Vaping Use: Never used  Substance and Sexual Activity   Alcohol use: Yes    Comment: socially on weekends   Drug use: Never   Sexual activity: Yes    Partners: Male    Birth control/protection: Pill  Other Topics  Concern   Not on file  Social History Narrative   Not on file   Social Determinants of Health   Financial Resource Strain: Not on file  Food Insecurity: Not on file  Transportation Needs: Not on file  Physical Activity: Not on file  Stress: Not on file  Social Connections: Not on file  Intimate Partner Violence: Not on file    Review of Systems  Gastrointestinal:  Positive for abdominal distention.  Genitourinary:  Positive for menstrual problem and vaginal bleeding.       Cramping Vaginal odor   All other systems reviewed and are negative.  PHYSICAL EXAMINATION:    BP 122/72 (BP Location: Right Arm, Patient Position: Sitting, Cuff Size: Normal)   Pulse 72   Ht 5\' 3"  (1.6 m)   Wt 137 lb 12.8 oz (62.5 kg)   LMP 03/25/2021   BMI 24.41 kg/m     General appearance: alert, cooperative and appears stated age  Pelvic: External genitalia:  no lesions              Urethra:  normal appearing urethra with no masses, tenderness or lesions              Bartholins and Skenes: normal                 Vagina: normal appearing vagina with normal color and discharge, no lesions              Cervix:  no lesions                Bimanual Exam:  Uterus:  normal size, contour, position, consistency, mobility, non-tender              Adnexa: no mass, fullness, tenderness               Chaperone was present for exam:  yes.  ASSESSMENT  Breakthrough bleeding on COCs. Hx low ferritin.   PLAN  Reassurance given regarding episode of bleeding, which is expected with a missed pill.  Ok to continue COCs and have a menses now.  CBC and ferritin. Will need to advise on iron frequency. She is currently taking it three days a week.  For recurrent bleeding, will do pelvic ultrasound.  Fu prn.   An After Visit Summary was printed and given to the patient.

## 2021-04-06 ENCOUNTER — Ambulatory Visit (INDEPENDENT_AMBULATORY_CARE_PROVIDER_SITE_OTHER): Payer: 59 | Admitting: Obstetrics and Gynecology

## 2021-04-06 ENCOUNTER — Other Ambulatory Visit: Payer: Self-pay

## 2021-04-06 ENCOUNTER — Encounter: Payer: Self-pay | Admitting: Obstetrics and Gynecology

## 2021-04-06 VITALS — BP 122/72 | HR 72 | Ht 63.0 in | Wt 137.8 lb

## 2021-04-06 DIAGNOSIS — N926 Irregular menstruation, unspecified: Secondary | ICD-10-CM | POA: Diagnosis not present

## 2021-04-06 DIAGNOSIS — R790 Abnormal level of blood mineral: Secondary | ICD-10-CM | POA: Diagnosis not present

## 2021-04-06 LAB — CBC WITH DIFFERENTIAL/PLATELET
Absolute Monocytes: 756 cells/uL (ref 200–950)
Basophils Absolute: 42 cells/uL (ref 0–200)
Basophils Relative: 0.5 %
Eosinophils Absolute: 143 cells/uL (ref 15–500)
Eosinophils Relative: 1.7 %
HCT: 42.8 % (ref 35.0–45.0)
Hemoglobin: 14.1 g/dL (ref 11.7–15.5)
Lymphs Abs: 3209 cells/uL (ref 850–3900)
MCH: 29.8 pg (ref 27.0–33.0)
MCHC: 32.9 g/dL (ref 32.0–36.0)
MCV: 90.5 fL (ref 80.0–100.0)
MPV: 12 fL (ref 7.5–12.5)
Monocytes Relative: 9 %
Neutro Abs: 4250 cells/uL (ref 1500–7800)
Neutrophils Relative %: 50.6 %
Platelets: 234 10*3/uL (ref 140–400)
RBC: 4.73 10*6/uL (ref 3.80–5.10)
RDW: 12.1 % (ref 11.0–15.0)
Total Lymphocyte: 38.2 %
WBC: 8.4 10*3/uL (ref 3.8–10.8)

## 2021-04-06 LAB — PREGNANCY, URINE: Preg Test, Ur: NEGATIVE

## 2021-04-06 LAB — FERRITIN: Ferritin: 10 ng/mL — ABNORMAL LOW (ref 16–232)

## 2021-04-13 DIAGNOSIS — R790 Abnormal level of blood mineral: Secondary | ICD-10-CM | POA: Insufficient documentation

## 2021-04-13 NOTE — Addendum Note (Signed)
Addended by: Yisroel Ramming, Dietrich Pates E on: 04/13/2021 03:18 PM   Modules accepted: Orders

## 2021-06-29 ENCOUNTER — Other Ambulatory Visit: Payer: Self-pay | Admitting: Obstetrics and Gynecology

## 2021-06-29 DIAGNOSIS — Z1231 Encounter for screening mammogram for malignant neoplasm of breast: Secondary | ICD-10-CM

## 2021-07-24 ENCOUNTER — Telehealth: Payer: Self-pay | Admitting: Obstetrics and Gynecology

## 2021-07-24 ENCOUNTER — Other Ambulatory Visit: Payer: Self-pay | Admitting: Obstetrics and Gynecology

## 2021-07-24 DIAGNOSIS — R790 Abnormal level of blood mineral: Secondary | ICD-10-CM

## 2021-07-24 NOTE — Telephone Encounter (Signed)
Please reach out to patient with a reminder to schedule a lab visit to recheck her anemia.   She is due this month, February.

## 2021-07-24 NOTE — Progress Notes (Signed)
Labs reordered for recheck of CBC and ferritin.

## 2021-07-26 ENCOUNTER — Other Ambulatory Visit: Payer: Self-pay | Admitting: Obstetrics and Gynecology

## 2021-07-26 DIAGNOSIS — Z1231 Encounter for screening mammogram for malignant neoplasm of breast: Secondary | ICD-10-CM

## 2021-07-26 NOTE — Telephone Encounter (Signed)
Patient is coming on 07/30/21 for recheck. Lab appointment scheduled.

## 2021-07-26 NOTE — Telephone Encounter (Signed)
Encounter reviewed and closed.  

## 2021-07-30 ENCOUNTER — Other Ambulatory Visit: Payer: Self-pay

## 2021-07-30 ENCOUNTER — Other Ambulatory Visit: Payer: 59

## 2021-07-30 DIAGNOSIS — Z1231 Encounter for screening mammogram for malignant neoplasm of breast: Secondary | ICD-10-CM

## 2021-07-30 DIAGNOSIS — R790 Abnormal level of blood mineral: Secondary | ICD-10-CM

## 2021-07-30 LAB — CBC
HCT: 38.2 % (ref 35.0–45.0)
Hemoglobin: 12.4 g/dL (ref 11.7–15.5)
MCH: 27.9 pg (ref 27.0–33.0)
MCHC: 32.5 g/dL (ref 32.0–36.0)
MCV: 85.8 fL (ref 80.0–100.0)
MPV: 12.5 fL (ref 7.5–12.5)
Platelets: 247 10*3/uL (ref 140–400)
RBC: 4.45 10*6/uL (ref 3.80–5.10)
RDW: 13.9 % (ref 11.0–15.0)
WBC: 5.4 10*3/uL (ref 3.8–10.8)

## 2021-07-30 LAB — FERRITIN: Ferritin: 4 ng/mL — ABNORMAL LOW (ref 16–232)

## 2021-08-02 ENCOUNTER — Telehealth: Payer: Self-pay | Admitting: *Deleted

## 2021-08-02 DIAGNOSIS — R790 Abnormal level of blood mineral: Secondary | ICD-10-CM

## 2021-08-02 NOTE — Telephone Encounter (Signed)
-----   Message from [name], MD sent at 07/31/2021  9:03 AM EST ----- Please contact patient about a referral to see hematology for low ferritin.   Hi Chloe Valdez,   I have reviewed your blood work, and your ferritin (iron storage) continues to be quite low.   You may benefit from seeing a hematologist (blood specialist) at this point.  It is possible that you are not taking enough iron, or that your body is not absorbing it well.   I will have the triage nurse reach out to you next week to see about facilitating this appointment for you here in [place].   Please contact the office for any questions.   Have a good day!  Josefa Half, MD

## 2021-08-02 NOTE — Telephone Encounter (Signed)
Referral placed at [facility] hematology they will call to schedule.

## 2021-08-03 ENCOUNTER — Telehealth: Payer: Self-pay | Admitting: Hematology and Oncology

## 2021-08-03 NOTE — Telephone Encounter (Signed)
Scheduled appt per 2/27 referral. Pt is aware of appt date and time. Pt is aware to arrive 15 mins prior to appt time and to bring and updated insurance card. Pt is aware of appt location.   °

## 2021-08-05 NOTE — Telephone Encounter (Signed)
Patient scheduled on 08/18/21 with Nicholas Lose, MD ?

## 2021-08-06 DIAGNOSIS — Z1231 Encounter for screening mammogram for malignant neoplasm of breast: Secondary | ICD-10-CM

## 2021-08-18 NOTE — Progress Notes (Signed)
[place] ?CONSULT NOTE ? ?Patient Care Team: ?Kathyrn Lass, MD as PCP - General (Family Medicine) ? ?CHIEF COMPLAINTS/PURPOSE OF CONSULTATION:  ?Low Ferritin ? ?HISTORY OF PRESENTING ILLNESS:  ?Chloe Valdez 46 y.o. female is here because of recent diagnosis of Low Ferritin. She presents to the clinic today for new hematology.  She is complaining of fatigue and her iron levels have been low in spite of taking oral iron therapy for the past several months.  It appears that the hemoglobin has improved but the iron levels have not. ? ?I reviewed her records extensively and collaborated the history with the patient. ? ?MEDICAL HISTORY:  ?Past Medical History:  ?Diagnosis Date  ? Cystocele   ? Uterine prolapse   ? ? ?SURGICAL HISTORY: ?Past Surgical History:  ?Procedure Laterality Date  ? CESAREAN SECTION    ? mole excision    ? ? ?SOCIAL HISTORY: ?Social History  ? ?Socioeconomic History  ? Marital status: Married  ?  Spouse name: Not on file  ? Number of children: Not on file  ? Years of education: Not on file  ? Highest education level: Not on file  ?Occupational History  ? Not on file  ?Tobacco Use  ? Smoking status: Never  ? Smokeless tobacco: Never  ?Vaping Use  ? Vaping Use: Never used  ?Substance and Sexual Activity  ? Alcohol use: Yes  ?  Comment: socially on weekends  ? Drug use: Never  ? Sexual activity: Yes  ?  Partners: Male  ?  Birth control/protection: Pill  ?Other Topics Concern  ? Not on file  ?Social History Narrative  ? Not on file  ? ?Social Determinants of Health  ? ?Financial Resource Strain: Not on file  ?Food Insecurity: Not on file  ?Transportation Needs: Not on file  ?Physical Activity: Not on file  ?Stress: Not on file  ?Social Connections: Not on file  ?Intimate Partner Violence: Not on file  ? ? ?FAMILY HISTORY: ?Family History  ?Problem Relation Age of Onset  ? Breast cancer Mother   ?     AGE 71'S  ? Cancer Father   ?     Prostate  ? Cancer Maternal Grandfather   ?      PROSTATE AND BILE DUCT CANCER  ? Hyperlipidemia Paternal Uncle   ? Colon cancer Neg Hx   ? Esophageal cancer Neg Hx   ? Pancreatic cancer Neg Hx   ? Stomach cancer Neg Hx   ? Liver disease Neg Hx   ? ? ?ALLERGIES:  is allergic to sulfa antibiotics. ? ?MEDICATIONS:  ?Current Outpatient Medications  ?Medication Sig Dispense Refill  ? Ferrous Sulfate (IRON PO) Take by mouth 3 (three) times a week.    ? levocetirizine (XYZAL) 5 MG tablet Take 5 mg by mouth every evening.    ? Norethindrone Acetate-Ethinyl Estrad-FE (LOESTRIN 24 FE) 1-20 MG-MCG(24) tablet Take 1 tablet by mouth daily. 84 tablet 4  ? ?No current facility-administered medications for this visit.  ? ? ?REVIEW OF SYSTEMS:   ?Constitutional: Denies fevers, chills or abnormal night sweats ?  ?All other systems were reviewed with the patient and are negative. ? ?PHYSICAL EXAMINATION: ?ECOG PERFORMANCE STATUS: 1 - Symptomatic but completely ambulatory ? ?Vitals:  ? 08/20/21 1250  ?BP: 122/74  ?Pulse: 88  ?Resp: 17  ?Temp: 97.9 ?F (36.6 ?C)  ?SpO2: 100%  ? ?Filed Weights  ? 08/20/21 1250  ?Weight: 143 lb 4.8 oz (65 kg)  ?  ? ?  LABORATORY DATA:  ?I have reviewed the data as listed ?Lab Results  ?Component Value Date  ? WBC 5.4 07/30/2021  ? HGB 12.4 07/30/2021  ? HCT 38.2 07/30/2021  ? MCV 85.8 07/30/2021  ? PLT 247 07/30/2021  ? ?Lab Results  ?Component Value Date  ? NA 137 11/05/2020  ? K 4.8 11/05/2020  ? CL 106 11/05/2020  ? CO2 22 11/05/2020  ? ? ?RADIOGRAPHIC STUDIES: ?I have personally reviewed the radiological reports and agreed with the findings in the report. ? ?ASSESSMENT AND PLAN:  ?Low ferritin ?Lab review ?12/14/2020: Iron saturation 34%, ferritin 8.6 ?07/30/2021: Ferritin 4, hemoglobin 12.4, MCV 85.8 ? ?Discussion: I discussed with the patient that this is referred to as iron deficiency without anemia.   ?Because of her iron deficiency is frequent blood donations. ? ?Since she has been on oral iron and has not improved, I recommend that we give her  intravenous iron therapy. ?She had upper endoscopy and colonoscopy and GI evaluation and rule out blood loss. ? ?Symptoms of iron deficiency anemia include fatigue, depression, headaches, impaired memory, decreased cognition, pica, palpitations, decreased exercise tolerance and shortness of breath as well as muscle and joint pains. ? ?Recheck labs in 4 months and follow-up after that with a telephone visit ? ? ?All questions were answered. The patient knows to call the clinic with any problems, questions or concerns. ?  ? [name], MD ?08/20/21 ? I Gardiner Coins, am acting as a scribe for Dr. Lindi Adie  ?

## 2021-08-20 ENCOUNTER — Inpatient Hospital Stay: Payer: 59

## 2021-08-20 ENCOUNTER — Other Ambulatory Visit: Payer: Self-pay

## 2021-08-20 ENCOUNTER — Inpatient Hospital Stay: Payer: 59 | Attending: Hematology and Oncology | Admitting: Hematology and Oncology

## 2021-08-20 DIAGNOSIS — R790 Abnormal level of blood mineral: Secondary | ICD-10-CM

## 2021-08-20 DIAGNOSIS — E611 Iron deficiency: Secondary | ICD-10-CM | POA: Insufficient documentation

## 2021-08-20 DIAGNOSIS — Z793 Long term (current) use of hormonal contraceptives: Secondary | ICD-10-CM | POA: Diagnosis not present

## 2021-08-20 DIAGNOSIS — Z803 Family history of malignant neoplasm of breast: Secondary | ICD-10-CM | POA: Insufficient documentation

## 2021-08-20 DIAGNOSIS — Z8042 Family history of malignant neoplasm of prostate: Secondary | ICD-10-CM | POA: Insufficient documentation

## 2021-08-20 DIAGNOSIS — Z79899 Other long term (current) drug therapy: Secondary | ICD-10-CM | POA: Diagnosis not present

## 2021-08-20 NOTE — Assessment & Plan Note (Signed)
Lab review ?12/14/2020: Iron saturation 34%, ferritin 8.6 ?07/30/2021: Ferritin 4, hemoglobin 12.4, MCV 85.8 ? ?Discussion: I discussed with the patient that this is referred to as iron deficiency without anemia.  It is poorly recognized and there are no clear guidelines for management.  It is in fact fairly common condition. ?Oral iron therapy is our recommendation. ?Occult blood losses will need to be evaluated.  I recommended gastroenterology evaluation. ? ?Symptoms of iron deficiency anemia include fatigue, depression, headaches, impaired memory, decreased cognition, pica, palpitations, decreased exercise tolerance and shortness of breath as well as muscle and joint pains. ? ?Recheck labs in 3 months and follow-up after that. ?

## 2021-08-30 ENCOUNTER — Inpatient Hospital Stay: Payer: 59

## 2021-08-30 ENCOUNTER — Other Ambulatory Visit: Payer: Self-pay

## 2021-08-30 VITALS — BP 106/77 | HR 78 | Temp 98.5°F | Resp 18

## 2021-08-30 DIAGNOSIS — E611 Iron deficiency: Secondary | ICD-10-CM | POA: Diagnosis not present

## 2021-08-30 DIAGNOSIS — R790 Abnormal level of blood mineral: Secondary | ICD-10-CM

## 2021-08-30 MED ORDER — SODIUM CHLORIDE 0.9 % IV SOLN
300.0000 mg | Freq: Once | INTRAVENOUS | Status: AC
  Administered 2021-08-30: 300 mg via INTRAVENOUS
  Filled 2021-08-30: qty 300

## 2021-08-30 MED ORDER — DIPHENHYDRAMINE HCL 25 MG PO CAPS
50.0000 mg | ORAL_CAPSULE | Freq: Once | ORAL | Status: AC
  Administered 2021-08-30: 50 mg via ORAL
  Filled 2021-08-30: qty 2

## 2021-08-30 MED ORDER — LORATADINE 10 MG PO TABS
10.0000 mg | ORAL_TABLET | Freq: Every day | ORAL | Status: DC
  Administered 2021-08-30: 10 mg via ORAL
  Filled 2021-08-30: qty 1

## 2021-08-30 MED ORDER — SODIUM CHLORIDE 0.9 % IV SOLN: Freq: Once | INTRAVENOUS | Status: AC

## 2021-08-30 MED ORDER — ACETAMINOPHEN 325 MG PO TABS
650.0000 mg | ORAL_TABLET | Freq: Once | ORAL | Status: AC
  Administered 2021-08-30: 650 mg via ORAL
  Filled 2021-08-30: qty 2

## 2021-08-30 NOTE — Patient Instructions (Signed)

## 2021-08-30 NOTE — Progress Notes (Signed)
Patient tolerated IV iron infusion well. Monitored for 30 minute post observation period without incident. VSS, ambulatory to lobby.  

## 2021-09-03 ENCOUNTER — Inpatient Hospital Stay: Payer: 59

## 2021-09-03 ENCOUNTER — Other Ambulatory Visit: Payer: Self-pay

## 2021-09-03 VITALS — BP 104/67 | HR 71 | Temp 98.3°F | Resp 18

## 2021-09-03 DIAGNOSIS — E611 Iron deficiency: Secondary | ICD-10-CM | POA: Diagnosis not present

## 2021-09-03 DIAGNOSIS — R790 Abnormal level of blood mineral: Secondary | ICD-10-CM

## 2021-09-03 MED ORDER — SODIUM CHLORIDE 0.9 % IV SOLN: Freq: Once | INTRAVENOUS | Status: AC

## 2021-09-03 MED ORDER — SODIUM CHLORIDE 0.9 % IV SOLN
300.0000 mg | Freq: Once | INTRAVENOUS | Status: AC
  Administered 2021-09-03: 300 mg via INTRAVENOUS
  Filled 2021-09-03: qty 300

## 2021-09-03 MED ORDER — DIPHENHYDRAMINE HCL 25 MG PO CAPS
50.0000 mg | ORAL_CAPSULE | Freq: Once | ORAL | Status: AC
  Administered 2021-09-03: 50 mg via ORAL
  Filled 2021-09-03: qty 2

## 2021-09-03 MED ORDER — ACETAMINOPHEN 325 MG PO TABS
650.0000 mg | ORAL_TABLET | Freq: Once | ORAL | Status: AC
  Administered 2021-09-03: 650 mg via ORAL
  Filled 2021-09-03: qty 2

## 2021-09-03 NOTE — Patient Instructions (Signed)

## 2021-09-03 NOTE — Progress Notes (Signed)
Patient observed for 30 minutes following administration of venofer infusion. Vital signs retaken and remained stable. Patient showed no signs of distress upon discharge.  

## 2021-09-10 ENCOUNTER — Inpatient Hospital Stay: Payer: 59 | Attending: Hematology and Oncology

## 2021-09-10 ENCOUNTER — Other Ambulatory Visit: Payer: Self-pay

## 2021-09-10 VITALS — BP 106/68 | HR 67 | Temp 98.7°F | Resp 18

## 2021-09-10 DIAGNOSIS — E611 Iron deficiency: Secondary | ICD-10-CM | POA: Insufficient documentation

## 2021-09-10 DIAGNOSIS — R790 Abnormal level of blood mineral: Secondary | ICD-10-CM

## 2021-09-10 MED ORDER — ACETAMINOPHEN 325 MG PO TABS
650.0000 mg | ORAL_TABLET | Freq: Once | ORAL | Status: AC
  Administered 2021-09-10: 650 mg via ORAL
  Filled 2021-09-10: qty 2

## 2021-09-10 MED ORDER — DIPHENHYDRAMINE HCL 25 MG PO CAPS
50.0000 mg | ORAL_CAPSULE | Freq: Once | ORAL | Status: AC
  Administered 2021-09-10: 50 mg via ORAL
  Filled 2021-09-10: qty 2

## 2021-09-10 MED ORDER — SODIUM CHLORIDE 0.9 % IV SOLN
300.0000 mg | Freq: Once | INTRAVENOUS | Status: AC
  Administered 2021-09-10: 300 mg via INTRAVENOUS
  Filled 2021-09-10: qty 300

## 2021-09-10 MED ORDER — SODIUM CHLORIDE 0.9 % IV SOLN: Freq: Once | INTRAVENOUS | Status: AC

## 2021-09-10 NOTE — Progress Notes (Signed)
Patient tolerated IV iron infusion well. Declined to stay for 30 minute post observation period. VSS, ambulatory to lobby.  

## 2021-09-10 NOTE — Patient Instructions (Signed)

## 2021-10-27 NOTE — Progress Notes (Signed)
46 y.o. G2P2 Married Caucasian female here for annual exam.    Has seen hematology and has done iron infusions.  Hematologist has recommended she stops donating blood.  She will have follow up in July. Energy seems a little bit better.   Did have a period that started early, but otherwise her cycles are regular.  Working to try to take her pills on time.  Urinary leakage if jumps on a trampoline.  Otherwise, good bladder control.  Voiding well.  PCP:   Kathyrn Lass, MD  Patient's last menstrual period was 10/20/2021.     Period Cycle (Days): 28 Period Duration (Days): 4 Period Pattern: Regular Menstrual Flow: Light, Moderate Dysmenorrhea: None     Sexually active: Yes.    The current method of family planning is OCP (estrogen/progesterone).    Exercising: Yes.   Home exercise.  Smoker:  no  Health Maintenance: Pap:  10-13-17 Normal Neg HPV,Pap 07-18-13 Normal Neg HPV History of abnormal Pap:  no MMG:  07-23-20 Normal Cat.C BiRADS 1.  [facility].  States 2 appointments were cancelled.  Colonoscopy:  02-01-21 5 mm polyp BMD:   N/A  Result  N/A TDaP:  UTD Gardasil:   no HIV: Blood donation Hep C:  Blood donation Screening Labs:  today.   reports that she has never smoked. She has never used smokeless tobacco. She reports current alcohol use. She reports that she does not use drugs.  Past Medical History:  Diagnosis Date   Anemia    Cystocele    Uterine prolapse     Past Surgical History:  Procedure Laterality Date   CESAREAN SECTION     mole excision      Current Outpatient Medications  Medication Sig Dispense Refill   levocetirizine (XYZAL) 5 MG tablet Take 5 mg by mouth every evening.     Multiple Vitamin (MULTIVITAMIN) tablet Take 1 tablet by mouth daily.     Norethindrone Acetate-Ethinyl Estrad-FE (LOESTRIN 24 FE) 1-20 MG-MCG(24) tablet Take 1 tablet by mouth daily. 84 tablet 4   No current facility-administered medications for this visit.    Family  History  Problem Relation Age of Onset   Breast cancer Mother        AGE 39'S   Cancer Father        Prostate   Cancer Maternal Grandfather        PROSTATE AND BILE DUCT CANCER   Hyperlipidemia Paternal Uncle    Colon cancer Neg Hx    Esophageal cancer Neg Hx    Pancreatic cancer Neg Hx    Stomach cancer Neg Hx    Liver disease Neg Hx     Review of Systems  All other systems reviewed and are negative.  Exam:   BP 118/76 (BP Location: Right Arm, Patient Position: Sitting, Cuff Size: Normal)   Pulse 80   Ht 5' 3.5" (1.613 m)   Wt 144 lb (65.3 kg)   LMP 10/20/2021   SpO2 100%   BMI 25.11 kg/m     General appearance: alert, cooperative and appears stated age Head: normocephalic, without obvious abnormality, atraumatic Neck: no adenopathy, supple, symmetrical, trachea midline and thyroid normal to inspection and palpation Lungs: clear to auscultation bilaterally Breasts: normal appearance, no masses or tenderness, No nipple retraction or dimpling, No nipple discharge or bleeding, No axillary adenopathy Heart: regular rate and rhythm Abdomen: soft, non-tender; no masses, no organomegaly Extremities: extremities normal, atraumatic, no cyanosis or edema Skin: skin color, texture, turgor normal.  No rashes or lesions Lymph nodes: cervical, supraclavicular, and axillary nodes normal. Neurologic: grossly normal  Pelvic: External genitalia:  no lesions              No abnormal inguinal nodes palpated.              Urethra:  normal appearing urethra with no masses, tenderness or lesions              Bartholins and Skenes: normal                 Vagina: normal appearing vagina with normal color and discharge, no lesions.  Almost second degree cystocele.               Cervix: no lesions              Pap taken: no Bimanual Exam:  Uterus:  normal size, contour, position, consistency, mobility, non-tender              Adnexa: no mass, fullness, tenderness              Rectal exam: yes.   Confirms.              Anus:  normal sphincter tone, no lesions  Chaperone was present for exam:  Maudie Mercury, CMA  Assessment:   Well woman visit with gynecologic exam. Cystocele.  Iron deficiency anemia.  FH breast cancer.   Mother in her 68s.    Plan: Mammogram screening discussed.  She will schedule.  Self breast awareness reviewed. Pap and HR HPV as above. Guidelines for Calcium, Vitamin D, regular exercise program including cardiovascular and weight bearing exercise. Cholesterol and CBC.  Refill of LoLoestrin x 3 packs.  She will update her mammogram to receive additional Rx for her pills.  Follow up annually and prn.   After visit summary provided.

## 2021-11-10 ENCOUNTER — Encounter: Payer: Self-pay | Admitting: Obstetrics and Gynecology

## 2021-11-10 ENCOUNTER — Ambulatory Visit (INDEPENDENT_AMBULATORY_CARE_PROVIDER_SITE_OTHER): Payer: 59 | Admitting: Obstetrics and Gynecology

## 2021-11-10 VITALS — BP 118/76 | HR 80 | Ht 63.5 in | Wt 144.0 lb

## 2021-11-10 DIAGNOSIS — Z3041 Encounter for surveillance of contraceptive pills: Secondary | ICD-10-CM

## 2021-11-10 DIAGNOSIS — Z01419 Encounter for gynecological examination (general) (routine) without abnormal findings: Secondary | ICD-10-CM | POA: Diagnosis not present

## 2021-11-10 DIAGNOSIS — Z0000 Encounter for general adult medical examination without abnormal findings: Secondary | ICD-10-CM

## 2021-11-10 LAB — COMPREHENSIVE METABOLIC PANEL
AG Ratio: 1.8 (calc) (ref 1.0–2.5)
ALT: 14 U/L (ref 6–29)
AST: 20 U/L (ref 10–30)
Albumin: 4.2 g/dL (ref 3.6–5.1)
Alkaline phosphatase (APISO): 41 U/L (ref 31–125)
BUN: 13 mg/dL (ref 7–25)
CO2: 25 mmol/L (ref 20–32)
Calcium: 9 mg/dL (ref 8.6–10.2)
Chloride: 107 mmol/L (ref 98–110)
Creat: 0.73 mg/dL (ref 0.50–0.99)
Globulin: 2.4 g/dL (calc) (ref 1.9–3.7)
Glucose, Bld: 81 mg/dL (ref 65–99)
Potassium: 4.2 mmol/L (ref 3.5–5.3)
Sodium: 138 mmol/L (ref 135–146)
Total Bilirubin: 0.5 mg/dL (ref 0.2–1.2)
Total Protein: 6.6 g/dL (ref 6.1–8.1)

## 2021-11-10 LAB — LIPID PANEL
Cholesterol: 226 mg/dL — ABNORMAL HIGH (ref <200)
HDL: 65 mg/dL (ref >=50)
LDL Cholesterol (Calc): 138 mg/dL (calc) — ABNORMAL HIGH
Non-HDL Cholesterol (Calc): 161 mg/dL (calc) — ABNORMAL HIGH (ref <130)
Total CHOL/HDL Ratio: 3.5 (calc) (ref <5.0)
Triglycerides: 109 mg/dL (ref <150)

## 2021-11-10 MED ORDER — NORETHIN ACE-ETH ESTRAD-FE 1-20 MG-MCG(24) PO TABS: 1.0000 | ORAL_TABLET | Freq: Every day | ORAL | 0 refills | Status: DC

## 2021-11-10 NOTE — Patient Instructions (Signed)

## 2021-11-11 ENCOUNTER — Ambulatory Visit
Admission: RE | Admit: 2021-11-11 | Discharge: 2021-11-11 | Disposition: A | Discharge status: Home / Self Care | Payer: 59 | Source: Ambulatory Visit | Attending: Obstetrics and Gynecology | Admitting: Obstetrics and Gynecology

## 2021-11-11 DIAGNOSIS — Z1231 Encounter for screening mammogram for malignant neoplasm of breast: Secondary | ICD-10-CM

## 2021-12-08 NOTE — Progress Notes (Signed)
HEMATOLOGY-ONCOLOGY TELEPHONE VISIT PROGRESS NOTE  I connected with Chloe Valdez on 12/21/21 at  3:15 PM EDT by telephone and verified that I am speaking with the correct person using two identifiers.  I discussed the limitations, risks, security and privacy concerns of performing an evaluation and management service by telephone and the availability of in person appointments.  I also discussed with the patient that there may be a patient responsible charge related to this service. The patient expressed understanding and agreed to proceed.     CHIEF COMPLAINTS/PURPOSE OF CONSULTATION:  Low Ferritin  History of Present Illness: 46 y.o. female is here because of recent diagnosis of Low Ferritin. She presents to the clinic today for via telephone follow-up to discuss labs.  She reports that after the iron infusion she has noticed a slight improvement in energy levels but overall no major change.  Assessment Plan:  Low ferritin Lab review 12/14/2020: Iron saturation 34%, ferritin 8.6 07/30/2021: Ferritin 4, hemoglobin 12.4, MCV 85.8 12/20/2021: Hemoglobin 14.4, iron saturation 64%, ferritin 75  IV iron: March 2023  Labs in 6 months and follow-up after that with a telephone visit    I discussed the assessment and treatment plan with the patient. The patient was provided an opportunity to ask questions and all were answered. The patient agreed with the plan and demonstrated an understanding of the instructions. The patient was advised to call back or seek an in-person evaluation if the symptoms worsen or if the condition fails to improve as anticipated.   I provided 11 minutes of non-face-to-face time during this encounter. [name], MD    I Gardiner Coins am scribing for Dr. Lindi Adie   I have reviewed the above documentation for accuracy and completeness, and I agree with the above.

## 2021-12-17 ENCOUNTER — Other Ambulatory Visit: Payer: Self-pay | Admitting: *Deleted

## 2021-12-17 DIAGNOSIS — R790 Abnormal level of blood mineral: Secondary | ICD-10-CM

## 2021-12-20 ENCOUNTER — Inpatient Hospital Stay: Payer: 59 | Attending: Hematology and Oncology

## 2021-12-20 ENCOUNTER — Other Ambulatory Visit: Payer: Self-pay

## 2021-12-20 DIAGNOSIS — E611 Iron deficiency: Secondary | ICD-10-CM | POA: Insufficient documentation

## 2021-12-20 DIAGNOSIS — R790 Abnormal level of blood mineral: Secondary | ICD-10-CM

## 2021-12-20 LAB — CBC WITH DIFFERENTIAL (CANCER CENTER ONLY)
Abs Immature Granulocytes: 0.02 10*3/uL (ref 0.00–0.07)
Basophils Absolute: 0 10*3/uL (ref 0.0–0.1)
Basophils Relative: 1 %
Eosinophils Absolute: 0.1 10*3/uL (ref 0.0–0.5)
Eosinophils Relative: 2 %
HCT: 41.8 % (ref 36.0–46.0)
Hemoglobin: 14.4 g/dL (ref 12.0–15.0)
Immature Granulocytes: 0 %
Lymphocytes Relative: 29 %
Lymphs Abs: 1.5 10*3/uL (ref 0.7–4.0)
MCH: 31.4 pg (ref 26.0–34.0)
MCHC: 34.4 g/dL (ref 30.0–36.0)
MCV: 91.3 fL (ref 80.0–100.0)
Monocytes Absolute: 0.3 10*3/uL (ref 0.1–1.0)
Monocytes Relative: 5 %
Neutro Abs: 3.3 10*3/uL (ref 1.7–7.7)
Neutrophils Relative %: 63 %
Platelet Count: 222 10*3/uL (ref 150–400)
RBC: 4.58 MIL/uL (ref 3.87–5.11)
RDW: 12.7 % (ref 11.5–15.5)
WBC Count: 5.3 10*3/uL (ref 4.0–10.5)
nRBC: 0 % (ref 0.0–0.2)

## 2021-12-20 LAB — FERRITIN: Ferritin: 75 ng/mL (ref 11–307)

## 2021-12-20 LAB — IRON AND IRON BINDING CAPACITY (CC-WL,HP ONLY)
Iron: 195 ug/dL — ABNORMAL HIGH (ref 28–170)
Saturation Ratios: 64 % — ABNORMAL HIGH (ref 10.4–31.8)
TIBC: 307 ug/dL (ref 250–450)
UIBC: 112 ug/dL — ABNORMAL LOW (ref 148–442)

## 2021-12-21 ENCOUNTER — Inpatient Hospital Stay (HOSPITAL_BASED_OUTPATIENT_CLINIC_OR_DEPARTMENT_OTHER): Payer: 59 | Admitting: Hematology and Oncology

## 2021-12-21 DIAGNOSIS — R790 Abnormal level of blood mineral: Secondary | ICD-10-CM

## 2021-12-21 NOTE — Assessment & Plan Note (Addendum)
Lab review 12/14/2020: Iron saturation 34%, ferritin 8.6 07/30/2021: Ferritin 4, hemoglobin 12.4, MCV 85.8 12/20/2021: Hemoglobin 14.4, iron saturation 64%, ferritin 75  IV iron: March 2023  Labs in 6 months and follow-up after that with a telephone visit

## 2021-12-22 ENCOUNTER — Telehealth: Payer: Self-pay | Admitting: Hematology and Oncology

## 2021-12-22 NOTE — Telephone Encounter (Signed)
Scheduled appointment per 7/18 los. Patient is aware.

## 2022-03-01 ENCOUNTER — Ambulatory Visit (INDEPENDENT_AMBULATORY_CARE_PROVIDER_SITE_OTHER): Payer: 59 | Admitting: Obstetrics and Gynecology

## 2022-03-01 ENCOUNTER — Encounter: Payer: Self-pay | Admitting: Obstetrics and Gynecology

## 2022-03-01 VITALS — BP 112/68 | HR 83 | Ht 63.0 in | Wt 144.0 lb

## 2022-03-01 DIAGNOSIS — N939 Abnormal uterine and vaginal bleeding, unspecified: Secondary | ICD-10-CM | POA: Diagnosis not present

## 2022-03-01 LAB — CBC
HCT: 42 % (ref 35.0–45.0)
Hemoglobin: 14.6 g/dL (ref 11.7–15.5)
MCH: 31.2 pg (ref 27.0–33.0)
MCHC: 34.8 g/dL (ref 32.0–36.0)
MCV: 89.7 fL (ref 80.0–100.0)
MPV: 12.5 fL (ref 7.5–12.5)
Platelets: 242 10*3/uL (ref 140–400)
RBC: 4.68 10*6/uL (ref 3.80–5.10)
RDW: 11.7 % (ref 11.0–15.0)
WBC: 7.4 10*3/uL (ref 3.8–10.8)

## 2022-03-01 LAB — PREGNANCY, URINE: Preg Test, Ur: NEGATIVE

## 2022-03-01 MED ORDER — NORGESTIMATE-ETH ESTRADIOL 0.25-35 MG-MCG PO TABS: 1.0000 | ORAL_TABLET | Freq: Every day | ORAL | 2 refills | Status: DC

## 2022-03-01 NOTE — Progress Notes (Signed)
GYNECOLOGY  VISIT   HPI: 46 y.o.   Married  Caucasian  female   G2P2 with Patient's last menstrual period was 02/11/2022 (exact date).   here for heavy cycle and passing clots x1-2 days. On 2nd week of pill pack. Has not missed any pills or taken any late.  No change in her pills.  Has passed 7 large blood clots today.   Had heavy bleeding in the past when she missed a pill.   She has had bleeding outside of her cycle time on this pill.   Feels a little tired and having some bloating and cramping.   Hgb 14.4, iron 195, ferritin 4 on 12/20/21.   GYNECOLOGIC HISTORY: Patient's last menstrual period was 02/11/2022 (exact date). Contraception:  OCPs Menopausal hormone therapy:  n/a Last mammogram:  11-11-21 Neg/BiRads1 Last pap smear:   10-13-17 Normal Neg HPV,Pap 07-18-13 Normal Neg HPV        OB History     Gravida  2   Para  2   Term      Preterm      AB      Living  2      SAB      IAB      Ectopic      Multiple      Live Births                 Patient Active Problem List   Diagnosis Date Noted   Low ferritin 04/13/2021    Past Medical History:  Diagnosis Date   Anemia    Cystocele    Uterine prolapse     Past Surgical History:  Procedure Laterality Date   CESAREAN SECTION     mole excision      Current Outpatient Medications  Medication Sig Dispense Refill   levocetirizine (XYZAL) 5 MG tablet Take 5 mg by mouth every evening.     Multiple Vitamin (MULTIVITAMIN) tablet Take 1 tablet by mouth daily.     norethindrone-ethinyl estradiol-FE (HAILEY FE 1/20) 1-20 MG-MCG tablet      No current facility-administered medications for this visit.     ALLERGIES: Sulfa antibiotics  Family History  Problem Relation Age of Onset   Breast cancer Mother        AGE 68'S   Cancer Father        Prostate   Cancer Maternal Grandfather        PROSTATE AND BILE DUCT CANCER   Hyperlipidemia Paternal Uncle    Colon cancer Neg Hx    Esophageal cancer Neg  Hx    Pancreatic cancer Neg Hx    Stomach cancer Neg Hx    Liver disease Neg Hx     Social History   Socioeconomic History   Marital status: Married    Spouse name: Not on file   Number of children: Not on file   Years of education: Not on file   Highest education level: Not on file  Occupational History   Not on file  Tobacco Use   Smoking status: Never   Smokeless tobacco: Never  Vaping Use   Vaping Use: Never used  Substance and Sexual Activity   Alcohol use: Yes    Comment: socially on weekends   Drug use: Never   Sexual activity: Yes    Partners: Male    Birth control/protection: Pill  Other Topics Concern   Not on file  Social History Narrative   Not on file  Social Determinants of Health   Financial Resource Strain: Not on file  Food Insecurity: Not on file  Transportation Needs: Not on file  Physical Activity: Not on file  Stress: Not on file  Social Connections: Not on file  Intimate Partner Violence: Not on file    Review of Systems  Genitourinary:  Positive for vaginal bleeding (heavy bleeding with clots x1-2 days).  All other systems reviewed and are negative.   PHYSICAL EXAMINATION:    BP 112/68   Pulse 83   Ht '5\' 3"'$  (1.6 m)   Wt 144 lb (65.3 kg)   LMP 02/11/2022 (Exact Date)   SpO2 99%   BMI 25.51 kg/m     General appearance: alert, cooperative and appears stated age   Pelvic: External genitalia:  no lesions              Urethra:  normal appearing urethra with no masses, tenderness or lesions              Bartholins and Skenes: normal                 Vagina: normal appearing vagina with normal color and discharge, no lesions              Cervix: no lesions.  Blood in vaginal vault.  Moderate flow noted.                 Bimanual Exam:  Uterus:  normal size, contour, position, consistency, mobility, non-tender              Adnexa: no mass, fullness, tenderness               Chaperone was present for exam:  Joy,  CMA  ASSESSMENT  Abnormal uterine bleeding.   Break through bleeding on Loestrin 24.   PLAN  UPT negative.  We discussed reasons for abnormal bleeding:  low dose birth control pills, polyps, fibroids, ovarian cysts.  Will stop LoEstrin 24 and start back on Sprintec.  #84, RF 2. She will take Sprintec twice daily for 5 days, and then take one by mouth daily.  She will skip placebo pills and go directly to a new pack.  She will then take her pills one by mouth daily and allow for a menstruation at the end of each pack to follow.  Check CBC today.  She will return for continued abnormal bleeding.  Would then do pelvic US.  I did briefly touch on the possibility of a Mirena IUD in the future if appropriate.   An After Visit Summary was printed and given to the patient.  27 min  total time was spent for this patient encounter, including preparation, face-to-face counseling with the patient, coordination of care, and documentation of the encounter.

## 2022-03-01 NOTE — Patient Instructions (Signed)
Chloe Valdez,   We are starting you back on Sprintec birth control.   Take one tablet by mouth twice a day for 5 days in a row.  Then take one tablet by mouth daily. Skip the placebo pills at the end of the pack, and go directly to a new pack.   Then take the pills as you normally would and have your regular menstrual cycle at the end of the second pack and the packs to follow.   Please return if your bleeding does not improve.

## 2022-03-22 ENCOUNTER — Ambulatory Visit (INDEPENDENT_AMBULATORY_CARE_PROVIDER_SITE_OTHER): Payer: 59 | Admitting: Obstetrics and Gynecology

## 2022-03-22 ENCOUNTER — Encounter: Payer: Self-pay | Admitting: Obstetrics and Gynecology

## 2022-03-22 VITALS — BP 111/72 | HR 106 | Wt 142.0 lb

## 2022-03-22 DIAGNOSIS — N921 Excessive and frequent menstruation with irregular cycle: Secondary | ICD-10-CM | POA: Diagnosis not present

## 2022-03-22 NOTE — Progress Notes (Unsigned)
GYNECOLOGY  VISIT   HPI: 46 y.o.   Married  Caucasian  female   G2P2 with Patient's last menstrual period was 02/11/2022 (exact date).   here for abnormal bleeding.  She finished that first pack of OCP and is a week into the 2nd pack. She states that her bleeding has not stopped. She says that the bleeding is not super heavy but a constant flow.  Some discomfort and bloating.  Hgb 14.6 on 03/01/22.  No skipped or late pills.   She stopped LoEstrin 24 and started Sprintec at her visit on 03/01/22 due to heavy irregular bleeding on the LoEstrin.  She did take the Sprintec bid x 5 days and then one by mouth daily.  She skipped the placebo pills and has started the second pack.   Her son's birthday is this weekend.  GYNECOLOGIC HISTORY: Patient's last menstrual period was 02/11/2022 (exact date). Contraception:  ortho-cyclen  Menopausal hormone therapy:  n/a Last mammogram:  11/12/21 BI-RADS1 Last pap smear:   10/13/17 WNL HPV neg         OB History     Gravida  2   Para  2   Term      Preterm      AB      Living  2      SAB      IAB      Ectopic      Multiple      Live Births                 Patient Active Problem List   Diagnosis Date Noted   Low ferritin 04/13/2021    Past Medical History:  Diagnosis Date   Anemia    Cystocele    Uterine prolapse     Past Surgical History:  Procedure Laterality Date   CESAREAN SECTION     mole excision      Current Outpatient Medications  Medication Sig Dispense Refill   levocetirizine (XYZAL) 5 MG tablet Take 5 mg by mouth every evening.     Multiple Vitamin (MULTIVITAMIN) tablet Take 1 tablet by mouth daily.     norgestimate-ethinyl estradiol (ORTHO-CYCLEN) 0.25-35 MG-MCG tablet Take 1 tablet by mouth daily. 84 tablet 2   No current facility-administered medications for this visit.     ALLERGIES: Sulfa antibiotics  Family History  Problem Relation Age of Onset   Breast cancer Mother        AGE 58'S    Cancer Father        Prostate   Cancer Maternal Grandfather        PROSTATE AND BILE DUCT CANCER   Hyperlipidemia Paternal Uncle    Colon cancer Neg Hx    Esophageal cancer Neg Hx    Pancreatic cancer Neg Hx    Stomach cancer Neg Hx    Liver disease Neg Hx     Social History   Socioeconomic History   Marital status: Married    Spouse name: Not on file   Number of children: Not on file   Years of education: Not on file   Highest education level: Not on file  Occupational History   Not on file  Tobacco Use   Smoking status: Never   Smokeless tobacco: Never  Vaping Use   Vaping Use: Never used  Substance and Sexual Activity   Alcohol use: Yes    Comment: socially on weekends   Drug use: Never   Sexual activity: Yes  Partners: Male    Birth control/protection: Pill  Other Topics Concern   Not on file  Social History Narrative   Not on file   Social Determinants of Health   Financial Resource Strain: Not on file  Food Insecurity: Not on file  Transportation Needs: Not on file  Physical Activity: Not on file  Stress: Not on file  Social Connections: Not on file  Intimate Partner Violence: Not on file    Review of Systems See HPI.   PHYSICAL EXAMINATION:    BP 111/72   Pulse (!) 106   Wt 142 lb (64.4 kg)   LMP 02/11/2022 (Exact Date)   SpO2 99%   BMI 25.15 kg/m     General appearance: alert, cooperative and appears stated age   Pelvic: External genitalia:  no lesions              Urethra:  normal appearing urethra with no masses, tenderness or lesions              Bartholins and Skenes: normal                 Vagina: normal appearing vagina with normal color and discharge, no lesions              Cervix: no lesions.  Mild vaginal blood flow.                Bimanual Exam:  Uterus:  normal size, contour, position, consistency, mobility, non-tender              Adnexa: no mass, fullness, tenderness          Chaperone was present for exam:  Eustace Pen,  CMA  ASSESSMENT  Menorrhagia with irregular menses. Hx low ferritin.   PLAN  She will continue her current birth control pills in a normal fashion and have a cycle at the end of this pack. She declines Aygestin.  Keep a bleeding calendar.  She will return for pelvic US and possible EMB.   An After Visit Summary was printed and given to the patient.

## 2022-04-21 ENCOUNTER — Telehealth: Payer: Self-pay | Admitting: *Deleted

## 2022-04-21 NOTE — Telephone Encounter (Signed)
Routing to Dr. Quincy Simmonds to review.

## 2022-04-21 NOTE — Telephone Encounter (Signed)
-----   Message from [place] sent at 04/21/2022  1:55 PM EST ----- Regarding: U/S no longer needed-to confirm with MD Inbound call from patient who advised she is no longer having the issue that lead to the U/S order. Would like to know from MD if u/s is still required. Otherwise she is electing to cancel the 11/21 appt.

## 2022-04-24 NOTE — Telephone Encounter (Signed)
If both her heavy and her irregular vaginal bleeding have resolved, she does not need the ultrasound and follow up appointment with me.

## 2022-04-26 ENCOUNTER — Other Ambulatory Visit: Payer: 59 | Admitting: Obstetrics and Gynecology

## 2022-04-26 ENCOUNTER — Other Ambulatory Visit: Payer: 59

## 2022-04-26 NOTE — Telephone Encounter (Signed)
Spoke with patient.  Patient states she is on third pack of OCP, cycles seem to be more regular at this time. States she will continue to monitor cycles, if irregular bleeding or heavy bleeding returns, she will call for appt. Questions answered.   Routing to provider for final review. Patient is agreeable to disposition. Will close encounter.  Orders cancelled for EMB and PUS.

## 2022-05-06 ENCOUNTER — Telehealth: Payer: Self-pay | Admitting: *Deleted

## 2022-05-06 NOTE — Telephone Encounter (Signed)
Patient called stating pharmacy won't fill her birth control pills until 05/25/22. Patient reports Dr.Silva told her to skip placebo pills in 1st back due to irregular bleeding. Patient is not longer having any bleeding, now taking whole pack of pills. I called and spoke with pharmacy and informed them of this and he was able to do override and patient can pick up Rx on 05/07/22. Patient aware.

## 2022-06-23 ENCOUNTER — Inpatient Hospital Stay: Payer: 59 | Attending: Hematology and Oncology

## 2022-06-23 ENCOUNTER — Other Ambulatory Visit: Payer: Self-pay

## 2022-06-23 DIAGNOSIS — R790 Abnormal level of blood mineral: Secondary | ICD-10-CM | POA: Diagnosis present

## 2022-06-23 LAB — CBC WITH DIFFERENTIAL (CANCER CENTER ONLY)
Abs Immature Granulocytes: 0.03 10*3/uL (ref 0.00–0.07)
Basophils Absolute: 0 10*3/uL (ref 0.0–0.1)
Basophils Relative: 1 %
Eosinophils Absolute: 0.2 10*3/uL (ref 0.0–0.5)
Eosinophils Relative: 2 %
HCT: 40.4 % (ref 36.0–46.0)
Hemoglobin: 13.8 g/dL (ref 12.0–15.0)
Immature Granulocytes: 0 %
Lymphocytes Relative: 36 %
Lymphs Abs: 2.8 10*3/uL (ref 0.7–4.0)
MCH: 30.3 pg (ref 26.0–34.0)
MCHC: 34.2 g/dL (ref 30.0–36.0)
MCV: 88.8 fL (ref 80.0–100.0)
Monocytes Absolute: 0.6 10*3/uL (ref 0.1–1.0)
Monocytes Relative: 7 %
Neutro Abs: 4.2 10*3/uL (ref 1.7–7.7)
Neutrophils Relative %: 54 %
Platelet Count: 225 10*3/uL (ref 150–400)
RBC: 4.55 MIL/uL (ref 3.87–5.11)
RDW: 13.1 % (ref 11.5–15.5)
WBC Count: 7.8 10*3/uL (ref 4.0–10.5)
nRBC: 0 % (ref 0.0–0.2)

## 2022-06-23 LAB — IRON AND IRON BINDING CAPACITY (CC-WL,HP ONLY)
Iron: 54 ug/dL (ref 28–170)
Saturation Ratios: 13 % (ref 10.4–31.8)
TIBC: 426 ug/dL (ref 250–450)
UIBC: 372 ug/dL (ref 148–442)

## 2022-06-24 LAB — FERRITIN: Ferritin: 14 ng/mL (ref 11–307)

## 2022-06-27 ENCOUNTER — Inpatient Hospital Stay (HOSPITAL_BASED_OUTPATIENT_CLINIC_OR_DEPARTMENT_OTHER): Payer: 59 | Admitting: Hematology and Oncology

## 2022-06-27 DIAGNOSIS — R790 Abnormal level of blood mineral: Secondary | ICD-10-CM | POA: Diagnosis not present

## 2022-06-27 NOTE — Assessment & Plan Note (Signed)
Lab review 12/14/2020: Iron saturation 34%, ferritin 8.6 07/30/2021: Ferritin 4, hemoglobin 12.4, MCV 85.8 12/20/2021: Hemoglobin 14.4, iron saturation 64%, ferritin 75 06/23/2022: Hemoglobin 13.8, iron saturation 13%, ferritin 14   IV iron: March 2023  Plan to recheck labs in 4 months and telephone visit after that.

## 2022-06-27 NOTE — Progress Notes (Signed)
HEMATOLOGY-ONCOLOGY TELEPHONE VISIT PROGRESS NOTE  I connected with our patient on 06/27/22 at  3:15 PM EST by telephone and verified that I am speaking with the correct person using two identifiers.  I discussed the limitations, risks, security and privacy concerns of performing an evaluation and management service by telephone and the availability of in person appointments.  I also discussed with the patient that there may be a patient responsible charge related to this service. The patient expressed understanding and agreed to proceed.   History of Present Illness: Follow-up to discuss results of iron studies.  Patient had remarkable improvement in energy levels after her iron was replaced.  She is here for 30-monthfollow-up with labs and denies any shortness of breath exertion.  She does feel tired but it is unclear if it is related to iron deficiency.    REVIEW OF SYSTEMS:   Constitutional: Denies fevers, chills or abnormal weight loss All other systems were reviewed with the patient and are negative. Observations/Objective:     Assessment Plan:  Low ferritin Lab review 12/14/2020: Iron saturation 34%, ferritin 8.6 07/30/2021: Ferritin 4, hemoglobin 12.4, MCV 85.8 12/20/2021: Hemoglobin 14.4, iron saturation 64%, ferritin 75 06/23/2022: Hemoglobin 13.8, iron saturation 13%, ferritin 14   IV iron: March 2023  Plan to recheck labs in 3 months and telephone visit after that.   I discussed the assessment and treatment plan with the patient. The patient was provided an opportunity to ask questions and all were answered. The patient agreed with the plan and demonstrated an understanding of the instructions. The patient was advised to call back or seek an in-person evaluation if the symptoms worsen or if the condition fails to improve as anticipated.   I provided 12 minutes of non-face-to-face time during this encounter.  This includes time for charting and coordination of care   VHarriette Ohara MD  I DGardiner Coinsam acting as a scribe for Dr.Dalin Caldera  I have reviewed the above documentation for accuracy and completeness, and I agree with the above.

## 2022-09-20 ENCOUNTER — Other Ambulatory Visit: Payer: Self-pay

## 2022-09-20 ENCOUNTER — Inpatient Hospital Stay: Payer: 59 | Attending: Hematology and Oncology

## 2022-09-20 DIAGNOSIS — R790 Abnormal level of blood mineral: Secondary | ICD-10-CM | POA: Insufficient documentation

## 2022-09-20 LAB — CBC WITH DIFFERENTIAL (CANCER CENTER ONLY)
Abs Immature Granulocytes: 0.01 10*3/uL (ref 0.00–0.07)
Basophils Absolute: 0 10*3/uL (ref 0.0–0.1)
Basophils Relative: 1 %
Eosinophils Absolute: 0.3 10*3/uL (ref 0.0–0.5)
Eosinophils Relative: 5 %
HCT: 38.8 % (ref 36.0–46.0)
Hemoglobin: 13.3 g/dL (ref 12.0–15.0)
Immature Granulocytes: 0 %
Lymphocytes Relative: 37 %
Lymphs Abs: 2.4 10*3/uL (ref 0.7–4.0)
MCH: 30 pg (ref 26.0–34.0)
MCHC: 34.3 g/dL (ref 30.0–36.0)
MCV: 87.6 fL (ref 80.0–100.0)
Monocytes Absolute: 0.6 10*3/uL (ref 0.1–1.0)
Monocytes Relative: 9 %
Neutro Abs: 3.1 10*3/uL (ref 1.7–7.7)
Neutrophils Relative %: 48 %
Platelet Count: 241 10*3/uL (ref 150–400)
RBC: 4.43 MIL/uL (ref 3.87–5.11)
RDW: 13.4 % (ref 11.5–15.5)
WBC Count: 6.4 10*3/uL (ref 4.0–10.5)
nRBC: 0 % (ref 0.0–0.2)

## 2022-09-20 LAB — IRON AND IRON BINDING CAPACITY (CC-WL,HP ONLY)
Iron: 91 ug/dL (ref 28–170)
Saturation Ratios: 22 % (ref 10.4–31.8)
TIBC: 419 ug/dL (ref 250–450)
UIBC: 328 ug/dL (ref 148–442)

## 2022-09-20 LAB — FERRITIN: Ferritin: 9 ng/mL — ABNORMAL LOW (ref 11–307)

## 2022-09-20 NOTE — Progress Notes (Signed)
HEMATOLOGY-ONCOLOGY TELEPHONE VISIT PROGRESS NOTE  I connected with our patient on 09/22/22 at  3:45 PM EDT by telephone and verified that I am speaking with the correct person using two identifiers.  I discussed the limitations, risks, security and privacy concerns of performing an evaluation and management service by telephone and the availability of in person appointments.  I also discussed with the patient that there may be a patient responsible charge related to this service. The patient expressed understanding and agreed to proceed.   History of Present Illness: Fatigue inspite of recent IV Iron    REVIEW OF SYSTEMS:   Constitutional: Denies fevers, chills or abnormal weight loss All other systems were reviewed with the patient and are negative. Observations/Objective:     Assessment Plan:  Low ferritin Lab review 12/14/2020: Iron saturation 34%, ferritin 8.6 07/30/2021: Ferritin 4, hemoglobin 12.4, MCV 85.8 12/20/2021: Hemoglobin 14.4, iron saturation 64%, ferritin 75 06/23/2022: Hemoglobin 13.8, iron saturation 13%, ferritin 14 09/20/2022: Hemoglobin 13.3, MCV 87.6, ferritin 9, iron saturation 22%   IV iron: March 2023   Since the patient is symptomatic with fatigue, I recommended 3 more doses of Venofer and recheck labs 2 weeks after that and telephone visit to discuss results    I discussed the assessment and treatment plan with the patient. The patient was provided an opportunity to ask questions and all were answered. The patient agreed with the plan and demonstrated an understanding of the instructions. The patient was advised to call back or seek an in-person evaluation if the symptoms worsen or if the condition fails to improve as anticipated.   I provided 12 minutes of non-face-to-face time during this encounter.  This includes time for charting and coordination of care   Tamsen Meek, MD  I Janan Ridge am acting as a scribe for Dr.Vinay Gudena  I have reviewed  the above documentation for accuracy and completeness, and I agree with the above.

## 2022-09-22 ENCOUNTER — Inpatient Hospital Stay (HOSPITAL_BASED_OUTPATIENT_CLINIC_OR_DEPARTMENT_OTHER): Payer: 59 | Admitting: Hematology and Oncology

## 2022-09-22 DIAGNOSIS — R790 Abnormal level of blood mineral: Secondary | ICD-10-CM

## 2022-09-22 NOTE — Assessment & Plan Note (Signed)
Lab review 12/14/2020: Iron saturation 34%, ferritin 8.6 07/30/2021: Ferritin 4, hemoglobin 12.4, MCV 85.8 12/20/2021: Hemoglobin 14.4, iron saturation 64%, ferritin 75 06/23/2022: Hemoglobin 13.8, iron saturation 13%, ferritin 14 09/20/2022: Hemoglobin 13.3, MCV 87.6, ferritin 9, iron saturation 22%   IV iron: March 2023   Since the patient is asymptomatic, we plan to recheck labs in 3 months and telephone visit after that.

## 2022-09-23 ENCOUNTER — Telehealth: Payer: Self-pay | Admitting: Hematology and Oncology

## 2022-09-26 ENCOUNTER — Telehealth: Payer: Self-pay | Admitting: Hematology and Oncology

## 2022-09-26 NOTE — Telephone Encounter (Signed)
Scheduled appointments per los. Patient is aware of the made appointments. 

## 2022-09-30 ENCOUNTER — Inpatient Hospital Stay: Payer: 59 | Attending: Hematology and Oncology

## 2022-09-30 ENCOUNTER — Telehealth: Payer: Self-pay

## 2022-09-30 ENCOUNTER — Inpatient Hospital Stay: Payer: 59

## 2022-09-30 NOTE — Telephone Encounter (Signed)
Pt called afterhours triage nurse at 0645 to report she would need to cancel appt d/t cough/congestion. She would like to r/s. Message sent to scheduler.

## 2022-10-05 ENCOUNTER — Telehealth: Payer: Self-pay | Admitting: Hematology and Oncology

## 2022-10-07 ENCOUNTER — Inpatient Hospital Stay: Payer: 59 | Attending: Hematology and Oncology

## 2022-10-07 VITALS — BP 109/75 | HR 66 | Temp 98.2°F | Resp 16

## 2022-10-07 DIAGNOSIS — R790 Abnormal level of blood mineral: Secondary | ICD-10-CM | POA: Insufficient documentation

## 2022-10-07 MED ORDER — DIPHENHYDRAMINE HCL 25 MG PO CAPS
50.0000 mg | ORAL_CAPSULE | Freq: Once | ORAL | Status: AC
  Administered 2022-10-07: 50 mg via ORAL
  Filled 2022-10-07: qty 2

## 2022-10-07 MED ORDER — SODIUM CHLORIDE 0.9 % IV SOLN
300.0000 mg | Freq: Once | INTRAVENOUS | Status: AC
  Administered 2022-10-07: 300 mg via INTRAVENOUS
  Filled 2022-10-07: qty 300

## 2022-10-07 MED ORDER — SODIUM CHLORIDE 0.9 % IV SOLN: Freq: Once | INTRAVENOUS | Status: AC

## 2022-10-07 NOTE — Patient Instructions (Signed)

## 2022-10-14 ENCOUNTER — Inpatient Hospital Stay: Payer: 59

## 2022-10-14 ENCOUNTER — Other Ambulatory Visit: Payer: Self-pay

## 2022-10-14 VITALS — BP 107/63 | HR 71 | Temp 98.3°F | Resp 16

## 2022-10-14 DIAGNOSIS — R790 Abnormal level of blood mineral: Secondary | ICD-10-CM

## 2022-10-14 MED ORDER — SODIUM CHLORIDE 0.9 % IV SOLN: Freq: Once | INTRAVENOUS | Status: AC

## 2022-10-14 MED ORDER — SODIUM CHLORIDE 0.9 % IV SOLN
300.0000 mg | Freq: Once | INTRAVENOUS | Status: AC
  Administered 2022-10-14: 300 mg via INTRAVENOUS
  Filled 2022-10-14: qty 300

## 2022-10-14 MED ORDER — DIPHENHYDRAMINE HCL 25 MG PO CAPS
50.0000 mg | ORAL_CAPSULE | Freq: Once | ORAL | Status: AC
  Administered 2022-10-14: 50 mg via ORAL
  Filled 2022-10-14: qty 2

## 2022-10-14 NOTE — Patient Instructions (Signed)

## 2022-10-14 NOTE — Progress Notes (Signed)
Patient declined post obs.  VSS at discharge, tolerated treatment well without incident.  Ambulatory to lobby.

## 2022-10-17 ENCOUNTER — Other Ambulatory Visit: Payer: Self-pay | Admitting: Obstetrics and Gynecology

## 2022-10-17 NOTE — Telephone Encounter (Signed)
Patient left message stating she will need RF until next AEX, has 1.5 weeks left.   Rx pended #84/0RF  Routing to Dr. Edward Jolly

## 2022-10-17 NOTE — Telephone Encounter (Signed)
Med refill request:Ortho -Cyclen 0.25-35 Mg-mcg  Last AEX: 11/10/21 -BS Next AEX: 11/16/22 -BS Last MMG (if hormonal med) 11/12/21 -Neg BiRads 1 Refill authorized: Please Advise?   Call placed to patient, left detailed message, ok per dpr. Asking if patient has enough RF to last until her AEX scheduled on 11/16/22? Return call to office at [phone], opt 4.

## 2022-10-21 ENCOUNTER — Inpatient Hospital Stay: Payer: 59

## 2022-10-21 VITALS — BP 99/76 | HR 80 | Temp 97.8°F | Resp 16 | Ht 63.0 in | Wt 132.5 lb

## 2022-10-21 DIAGNOSIS — R790 Abnormal level of blood mineral: Secondary | ICD-10-CM

## 2022-10-21 MED ORDER — SODIUM CHLORIDE 0.9 % IV SOLN
300.0000 mg | Freq: Once | INTRAVENOUS | Status: AC
  Administered 2022-10-21: 300 mg via INTRAVENOUS
  Filled 2022-10-21: qty 300

## 2022-10-21 MED ORDER — SODIUM CHLORIDE 0.9 % IV SOLN: Freq: Once | INTRAVENOUS | Status: AC

## 2022-10-21 MED ORDER — DIPHENHYDRAMINE HCL 25 MG PO CAPS
50.0000 mg | ORAL_CAPSULE | Freq: Once | ORAL | Status: AC
  Administered 2022-10-21: 50 mg via ORAL
  Filled 2022-10-21: qty 2

## 2022-10-21 NOTE — Patient Instructions (Signed)

## 2022-10-30 NOTE — Progress Notes (Signed)
HEMATOLOGY-ONCOLOGY TELEPHONE VISIT PROGRESS NOTE  I connected with our patient on 11/07/22 at 11:30 AM EDT by telephone and verified that I am speaking with the correct person using two identifiers.  I discussed the limitations, risks, security and privacy concerns of performing an evaluation and management service by telephone and the availability of in person appointments.  I also discussed with the patient that there may be a patient responsible charge related to this service. The patient expressed understanding and agreed to proceed.   History of Present Illness:  Chloe Valdez 47 y.o. female is here because of  symptomatic with fatigue. She presents to the clinic for a telephone follow-updiscuss results. She felt remarkable after receiving the iron infusion and all the fatigue had gone away.   REVIEW OF SYSTEMS:   Constitutional: Denies fevers, chills or abnormal weight loss All other systems were reviewed with the patient and are negative.  Observations/Objective:    Assessment Plan:  Low ferritin Lab review 12/14/2020: Iron saturation 34%, ferritin 8.6 07/30/2021: Ferritin 4, hemoglobin 12.4, MCV 85.8 12/20/2021: Hemoglobin 14.4, iron saturation 64%, ferritin 75 06/23/2022: Hemoglobin 13.8, iron saturation 13%, ferritin 14 09/20/2022: Hemoglobin 13.3, MCV 87.6, ferritin 9, iron saturation 22% 11/02/2022: Hemoglobin 13.5, MCV 89.5, iron saturation 23%, ferritin 194   IV iron: March 2023, May 2024 Remarkable improvement in her energy levels after the iron infusion.  Fatigue has completely resolved. Therefore with her the hemoglobin is not a good indicator of when she needs iron infusions.  It is primarily the ferritin levels.  We discussed what potentially could be the cause of this and possibly there could be malabsorption.  (Patient does not have any bleeding)  Return to clinic in 6 months with labs done ahead of time and telephone visit after that to discuss results   I  discussed the assessment and treatment plan with the patient. The patient was provided an opportunity to ask questions and all were answered. The patient agreed with the plan and demonstrated an understanding of the instructions. The patient was advised to call back or seek an in-person evaluation if the symptoms worsen or if the condition fails to improve as anticipated.   I provided 12 minutes of non-face-to-face time during this encounter.  This includes time for charting and coordination of care   Tamsen Meek, MD  I Janan Ridge am acting as a scribe for Dr.Gazella Anglin  I have reviewed the above documentation for accuracy and completeness, and I agree with the above.

## 2022-11-02 ENCOUNTER — Inpatient Hospital Stay: Payer: 59

## 2022-11-02 DIAGNOSIS — R790 Abnormal level of blood mineral: Secondary | ICD-10-CM

## 2022-11-02 LAB — CBC WITH DIFFERENTIAL (CANCER CENTER ONLY)
Abs Immature Granulocytes: 0.02 10*3/uL (ref 0.00–0.07)
Basophils Absolute: 0 10*3/uL (ref 0.0–0.1)
Basophils Relative: 0 %
Eosinophils Absolute: 0.1 10*3/uL (ref 0.0–0.5)
Eosinophils Relative: 1 %
HCT: 39.9 % (ref 36.0–46.0)
Hemoglobin: 13.5 g/dL (ref 12.0–15.0)
Immature Granulocytes: 0 %
Lymphocytes Relative: 32 %
Lymphs Abs: 2.6 10*3/uL (ref 0.7–4.0)
MCH: 30.3 pg (ref 26.0–34.0)
MCHC: 33.8 g/dL (ref 30.0–36.0)
MCV: 89.5 fL (ref 80.0–100.0)
Monocytes Absolute: 0.5 10*3/uL (ref 0.1–1.0)
Monocytes Relative: 6 %
Neutro Abs: 5 10*3/uL (ref 1.7–7.7)
Neutrophils Relative %: 61 %
Platelet Count: 226 10*3/uL (ref 150–400)
RBC: 4.46 MIL/uL (ref 3.87–5.11)
RDW: 14.3 % (ref 11.5–15.5)
WBC Count: 8.3 10*3/uL (ref 4.0–10.5)
nRBC: 0 % (ref 0.0–0.2)

## 2022-11-02 LAB — IRON AND IRON BINDING CAPACITY (CC-WL,HP ONLY)
Iron: 70 ug/dL (ref 28–170)
Saturation Ratios: 23 % (ref 10.4–31.8)
TIBC: 305 ug/dL (ref 250–450)
UIBC: 235 ug/dL (ref 148–442)

## 2022-11-02 NOTE — Progress Notes (Signed)
47 y.o. G2P2 Married Caucasian female here for annual exam.    Menses are regular and heavier than usual.  Not bleeding outside of her cycle time.   Taking birth control pills.   Did another iron infusion.   No bladder control problems.  Does Kegels.   Wants to check Lipids and chemistries.   PCP:   Sigmund Hazel, MD  Patient's last menstrual period was 10/19/2022.     Period Cycle (Days): 30 Period Duration (Days): 4-5 Period Pattern: Regular Menstrual Flow: Heavy, Moderate Menstrual Control: Tampon, Maxi pad Dysmenorrhea: (!) Moderate     Sexually active: Yes.    The current method of family planning is ortho-cyclen.    Exercising: Yes.     Walking and weight lifting Smoker:  no  Health Maintenance: Pap:  10/13/17 WNL HPV neg  History of abnormal Pap:  no MMG:  11/11/21 Breast Density Cat C, BI-RADS CAT 1 neg Colonoscopy:  02/01/21 - due in 2029. Tubular adenoma found.  BMD:   n/a  Result  n/a TDaP:  UTD Gardasil:   no HIV: donated blood Hep C: donated blood Screening Labs:   today   reports that she has never smoked. She has never used smokeless tobacco. She reports current alcohol use. She reports that she does not use drugs.  Past Medical History:  Diagnosis Date   Anemia    Cystocele    Uterine prolapse     Past Surgical History:  Procedure Laterality Date   CESAREAN SECTION     mole excision      Current Outpatient Medications  Medication Sig Dispense Refill   levocetirizine (XYZAL) 5 MG tablet Take 5 mg by mouth every evening.     Multiple Vitamin (MULTIVITAMIN) tablet Take 1 tablet by mouth daily.     norgestimate-ethinyl estradiol (ORTHO-CYCLEN) 0.25-35 MG-MCG tablet Take 1 tablet by mouth daily. 84 tablet 3   No current facility-administered medications for this visit.    Family History  Problem Relation Age of Onset   Breast cancer Mother        AGE 71'S   Hyperlipidemia Paternal Uncle    Cancer Maternal Grandfather        PROSTATE AND BILE  DUCT CANCER   Colon cancer Neg Hx    Esophageal cancer Neg Hx    Pancreatic cancer Neg Hx    Stomach cancer Neg Hx    Liver disease Neg Hx     Review of Systems  All other systems reviewed and are negative.   Exam:   BP 118/84 (BP Location: Right Arm, Patient Position: Sitting, Cuff Size: Normal)   Pulse 74   Ht 5' 4.5" (1.638 m)   Wt 130 lb (59 kg)   LMP 10/19/2022   SpO2 99%   BMI 21.97 kg/m     General appearance: alert, cooperative and appears stated age Head: normocephalic, without obvious abnormality, atraumatic Neck: no adenopathy, supple, symmetrical, trachea midline and thyroid normal to inspection and palpation Lungs: clear to auscultation bilaterally Breasts: normal appearance, no masses or tenderness, No nipple retraction or dimpling, No nipple discharge or bleeding, No axillary adenopathy Heart: regular rate and rhythm Abdomen: soft, non-tender; no masses, no organomegaly Extremities: extremities normal, atraumatic, no cyanosis or edema Skin: skin color, texture, turgor normal. No rashes or lesions Lymph nodes: cervical, supraclavicular, and axillary nodes normal. Neurologic: grossly normal  Pelvic: External genitalia:  no lesions              No abnormal  inguinal nodes palpated.              Urethra:  normal appearing urethra with no masses, tenderness or lesions              Bartholins and Skenes: normal                 Vagina: normal appearing vagina with normal color and discharge, no lesions.  First to second degree cystocele.              Cervix: no lesions              Pap taken: yes Bimanual Exam:  Uterus:  normal size, contour, position, consistency, mobility, non-tender              Adnexa: no mass, fullness, tenderness              Rectal exam: yes.  Confirms.              Anus:  normal sphincter tone, no lesions  Chaperone was present for exam:  Warren Lacy, CMA  Assessment:   Well woman visit with gynecologic exam. Cystocele.  Iron deficiency  anemia.  Elevated LDL cholesterol. FH breast cancer.  Mother in her 32s.    Plan: Mammogram screening discussed.  She will schedule this. Self breast awareness reviewed. Pap and HR HPV as above. Guidelines for Calcium, Vitamin D, regular exercise program including cardiovascular and weight bearing exercise. Refill of COCs for one year.  Lipid profile and CMP.  Accepts referral for genetic counseling and possible testing.  Follow up annually and prn.   After visit summary provided.

## 2022-11-03 LAB — FERRITIN: Ferritin: 194 ng/mL (ref 11–307)

## 2022-11-07 ENCOUNTER — Inpatient Hospital Stay: Payer: 59 | Attending: Hematology and Oncology | Admitting: Hematology and Oncology

## 2022-11-07 DIAGNOSIS — R790 Abnormal level of blood mineral: Secondary | ICD-10-CM | POA: Diagnosis not present

## 2022-11-07 NOTE — Assessment & Plan Note (Signed)
Lab review 12/14/2020: Iron saturation 34%, ferritin 8.6 07/30/2021: Ferritin 4, hemoglobin 12.4, MCV 85.8 12/20/2021: Hemoglobin 14.4, iron saturation 64%, ferritin 75 06/23/2022: Hemoglobin 13.8, iron saturation 13%, ferritin 14 09/20/2022: Hemoglobin 13.3, MCV 87.6, ferritin 9, iron saturation 22% 11/02/2022: Hemoglobin 13.5, MCV 89.5, iron saturation 23%, ferritin 194   IV iron: March 2023, May 2024   Return to clinic in 6 months with labs done ahead of time and telephone visit after that to discuss results

## 2022-11-16 ENCOUNTER — Ambulatory Visit (INDEPENDENT_AMBULATORY_CARE_PROVIDER_SITE_OTHER): Payer: 59 | Admitting: Obstetrics and Gynecology

## 2022-11-16 ENCOUNTER — Other Ambulatory Visit (HOSPITAL_COMMUNITY)
Admission: RE | Admit: 2022-11-16 | Discharge: 2022-11-16 | Disposition: A | Discharge status: Home / Self Care | Payer: 59 | Source: Ambulatory Visit | Attending: Obstetrics and Gynecology | Admitting: Obstetrics and Gynecology

## 2022-11-16 ENCOUNTER — Encounter: Payer: Self-pay | Admitting: Obstetrics and Gynecology

## 2022-11-16 VITALS — BP 118/84 | HR 74 | Ht 64.5 in | Wt 130.0 lb

## 2022-11-16 DIAGNOSIS — Z124 Encounter for screening for malignant neoplasm of cervix: Secondary | ICD-10-CM | POA: Insufficient documentation

## 2022-11-16 DIAGNOSIS — Z0000 Encounter for general adult medical examination without abnormal findings: Secondary | ICD-10-CM

## 2022-11-16 DIAGNOSIS — Z803 Family history of malignant neoplasm of breast: Secondary | ICD-10-CM | POA: Diagnosis not present

## 2022-11-16 DIAGNOSIS — Z01419 Encounter for gynecological examination (general) (routine) without abnormal findings: Secondary | ICD-10-CM | POA: Diagnosis not present

## 2022-11-16 MED ORDER — NORGESTIMATE-ETH ESTRADIOL 0.25-35 MG-MCG PO TABS: 1.0000 | ORAL_TABLET | Freq: Every day | ORAL | 3 refills | Status: DC

## 2022-11-16 NOTE — Patient Instructions (Signed)

## 2022-11-17 LAB — COMPREHENSIVE METABOLIC PANEL
AG Ratio: 1.7 (calc) (ref 1.0–2.5)
ALT: 11 U/L (ref 6–29)
AST: 12 U/L (ref 10–35)
Albumin: 4.1 g/dL (ref 3.6–5.1)
Alkaline phosphatase (APISO): 41 U/L (ref 31–125)
BUN: 10 mg/dL (ref 7–25)
CO2: 23 mmol/L (ref 20–32)
Calcium: 9.2 mg/dL (ref 8.6–10.2)
Chloride: 107 mmol/L (ref 98–110)
Creat: 0.65 mg/dL (ref 0.50–0.99)
Globulin: 2.4 g/dL (calc) (ref 1.9–3.7)
Glucose, Bld: 91 mg/dL (ref 65–99)
Potassium: 5 mmol/L (ref 3.5–5.3)
Sodium: 138 mmol/L (ref 135–146)
Total Bilirubin: 0.4 mg/dL (ref 0.2–1.2)
Total Protein: 6.5 g/dL (ref 6.1–8.1)

## 2022-11-17 LAB — LIPID PANEL
Cholesterol: 208 mg/dL — ABNORMAL HIGH (ref <200)
HDL: 74 mg/dL (ref >=50)
LDL Cholesterol (Calc): 105 mg/dL (calc) — ABNORMAL HIGH
Non-HDL Cholesterol (Calc): 134 mg/dL (calc) — ABNORMAL HIGH (ref <130)
Total CHOL/HDL Ratio: 2.8 (calc) (ref <5.0)
Triglycerides: 169 mg/dL — ABNORMAL HIGH (ref <150)

## 2022-11-18 ENCOUNTER — Telehealth: Payer: Self-pay | Admitting: Hematology and Oncology

## 2022-11-18 LAB — CYTOLOGY - PAP
Comment: NEGATIVE
Diagnosis: NEGATIVE
High risk HPV: NEGATIVE

## 2022-11-18 NOTE — Telephone Encounter (Signed)
Called patient on 6/13 and spoke with them about [employer], patient wanted a call back due to them being busy that day. Had left a message on 6/14 with information and contact.

## 2022-12-13 ENCOUNTER — Other Ambulatory Visit: Payer: Self-pay | Admitting: Obstetrics and Gynecology

## 2022-12-13 DIAGNOSIS — Z1231 Encounter for screening mammogram for malignant neoplasm of breast: Secondary | ICD-10-CM

## 2022-12-28 ENCOUNTER — Encounter: Payer: Self-pay | Admitting: Hematology and Oncology

## 2023-01-13 ENCOUNTER — Ambulatory Visit: Admission: RE | Admit: 2023-01-13 | Payer: 59 | Source: Ambulatory Visit

## 2023-01-13 DIAGNOSIS — Z1231 Encounter for screening mammogram for malignant neoplasm of breast: Secondary | ICD-10-CM

## 2023-12-18 ENCOUNTER — Other Ambulatory Visit: Payer: Self-pay

## 2023-12-18 MED ORDER — NORGESTIMATE-ETH ESTRADIOL 0.25-35 MG-MCG PO TABS: 1.0000 | ORAL_TABLET | Freq: Every day | ORAL | 0 refills | Status: AC

## 2023-12-18 NOTE — Telephone Encounter (Signed)
 Medication refill request: ortho cyclen Last AEX:  11-16-22 Next AEX: not scheduled Last MMG (if hormonal medication request): 01-13-23 birads 1:neg Refill authorized: patient called & states she is scheduled to see another provider at a different location next month & just needs a refill to get her to that appt. Please approve if appropriate
